# Patient Record
Sex: Male | Born: 1960 | Race: Black or African American | Hispanic: No | Marital: Single | Smoking: Current every day smoker
Health system: Southern US, Community
[De-identification: ages and names within clinical notes are randomized; demographics above are authoritative.]

## PROBLEM LIST (undated history)

## (undated) DIAGNOSIS — I10 Essential (primary) hypertension: Secondary | ICD-10-CM

## (undated) HISTORY — PX: ABDOMINAL SURGERY: SHX537

---

## 2013-04-27 ENCOUNTER — Emergency Department (HOSPITAL_BASED_OUTPATIENT_CLINIC_OR_DEPARTMENT_OTHER): Payer: Medicaid Other

## 2013-04-27 ENCOUNTER — Encounter (HOSPITAL_BASED_OUTPATIENT_CLINIC_OR_DEPARTMENT_OTHER): Payer: Self-pay | Admitting: Emergency Medicine

## 2013-04-27 ENCOUNTER — Emergency Department (HOSPITAL_BASED_OUTPATIENT_CLINIC_OR_DEPARTMENT_OTHER)
Admission: EM | Admit: 2013-04-27 | Discharge: 2013-04-27 | Disposition: A | Discharge status: Home / Self Care | Payer: Medicaid Other | Attending: Emergency Medicine | Admitting: Emergency Medicine

## 2013-04-27 DIAGNOSIS — K029 Dental caries, unspecified: Secondary | ICD-10-CM

## 2013-04-27 DIAGNOSIS — L0201 Cutaneous abscess of face: Secondary | ICD-10-CM | POA: Insufficient documentation

## 2013-04-27 DIAGNOSIS — K0520 Aggressive periodontitis, unspecified: Secondary | ICD-10-CM | POA: Insufficient documentation

## 2013-04-27 DIAGNOSIS — I10 Essential (primary) hypertension: Secondary | ICD-10-CM | POA: Insufficient documentation

## 2013-04-27 DIAGNOSIS — R6883 Chills (without fever): Secondary | ICD-10-CM | POA: Insufficient documentation

## 2013-04-27 DIAGNOSIS — L03211 Cellulitis of face: Secondary | ICD-10-CM | POA: Insufficient documentation

## 2013-04-27 DIAGNOSIS — F17200 Nicotine dependence, unspecified, uncomplicated: Secondary | ICD-10-CM | POA: Insufficient documentation

## 2013-04-27 HISTORY — DX: Essential (primary) hypertension: I10

## 2013-04-27 LAB — CBC WITH DIFFERENTIAL/PLATELET
BASOS ABS: 0 10*3/uL (ref 0.0–0.1)
BASOS PCT: 0 % (ref 0–1)
EOS ABS: 0.2 10*3/uL (ref 0.0–0.7)
Eosinophils Relative: 2 % (ref 0–5)
HCT: 44 % (ref 39.0–52.0)
Hemoglobin: 14.4 g/dL (ref 13.0–17.0)
Lymphocytes Relative: 19 % (ref 12–46)
Lymphs Abs: 1.4 10*3/uL (ref 0.7–4.0)
MCH: 28.1 pg (ref 26.0–34.0)
MCHC: 32.7 g/dL (ref 30.0–36.0)
MCV: 85.8 fL (ref 78.0–100.0)
Monocytes Absolute: 0.5 10*3/uL (ref 0.1–1.0)
Monocytes Relative: 6 % (ref 3–12)
NEUTROS ABS: 5.4 10*3/uL (ref 1.7–7.7)
Neutrophils Relative %: 72 % (ref 43–77)
PLATELETS: 290 10*3/uL (ref 150–400)
RBC: 5.13 MIL/uL (ref 4.22–5.81)
RDW: 14.9 % (ref 11.5–15.5)
WBC: 7.5 10*3/uL (ref 4.0–10.5)

## 2013-04-27 LAB — BASIC METABOLIC PANEL
BUN: 10 mg/dL (ref 6–23)
CHLORIDE: 100 meq/L (ref 96–112)
CO2: 26 mEq/L (ref 19–32)
Calcium: 9.4 mg/dL (ref 8.4–10.5)
Creatinine, Ser: 1 mg/dL (ref 0.50–1.35)
GFR calc Af Amer: >90 mL/min (ref >90)
GFR, EST NON AFRICAN AMERICAN: 85 mL/min — AB (ref >90)
Glucose, Bld: 116 mg/dL — ABNORMAL HIGH (ref 70–99)
POTASSIUM: 3.9 meq/L (ref 3.7–5.3)
Sodium: 139 mEq/L (ref 137–147)

## 2013-04-27 MED ORDER — IOHEXOL 300 MG/ML  SOLN
80.0000 mL | Freq: Once | INTRAMUSCULAR | Status: AC | PRN
  Administered 2013-04-27: 80 mL via INTRAVENOUS

## 2013-04-27 MED ORDER — SODIUM CHLORIDE 0.9 % IV BOLUS (SEPSIS)
1000.0000 mL | Freq: Once | INTRAVENOUS | Status: AC
  Administered 2013-04-27: 1000 mL via INTRAVENOUS

## 2013-04-27 MED ORDER — MORPHINE SULFATE 4 MG/ML IJ SOLN
4.0000 mg | Freq: Once | INTRAMUSCULAR | Status: AC
  Administered 2013-04-27: 4 mg via INTRAVENOUS
  Filled 2013-04-27: qty 1

## 2013-04-27 MED ORDER — CLINDAMYCIN PHOSPHATE 600 MG/50ML IV SOLN
600.0000 mg | Freq: Once | INTRAVENOUS | Status: AC
  Administered 2013-04-27: 600 mg via INTRAVENOUS
  Filled 2013-04-27: qty 50

## 2013-04-27 MED ORDER — HYDROCODONE-ACETAMINOPHEN 5-325 MG PO TABS: 1.0000 | ORAL_TABLET | ORAL | Status: DC | PRN

## 2013-04-27 NOTE — ED Provider Notes (Signed)
This chart was scribed for Steven MawKristen N Kengo Sturges, DO by Dorothey Basemania Sutton, ED Scribe. This patient was seen in room MH08/MH08 and the patient's care was started at 6:25 PM.  CHIEF COMPLAINT: cellulitis   HPI:  HPI Comments: Ephriam JenkinsRicky King is a 53 y.o. male who presents to the Emergency Department complaining of a constant pain to the right, upper dentition with associated gingival abscesses and chills onset 4-5 days ago. He reports associated swelling around the bilateral sides of the jaw onset a few hours ago that he states has been progressively worsening since he "popped" an abscess around the right, upper gingiva. Patient reports that he was evaluated at [facility] 2 days ago and started on clindamycin. He states his symptoms have not improved. He was seen by his PCP at [facility] today who is concerned he may have periorbital cellulitis and sent him to the emergency department for evaluation. He denies fever, emesis, diarrhea. He denies any allergies to medications. Patient reports that he does not currently have a dentist states he did call a dentist yesterday that he was referred to from [facility] he states he will see him once his swelling has improved. Patient has a history of HTN.    ROS: See HPI Constitutional: chills, no fever  Eyes: no drainage  ENT: dental problem, facial swelling, no runny nose   Cardiovascular: no chest pain  Resp: no SOB  GI: no vomiting, no diarrhea GU: no dysuria Integumentary: no rash  Allergy: no hives  Musculoskeletal: no leg swelling  Neurological: no slurred speech ROS otherwise negative  PAST MEDICAL HISTORY/PAST SURGICAL HISTORY:  Past Medical History  Diagnosis Date  . Hypertension     MEDICATIONS:  Prior to Admission medications   Medication Sig Start Date End Date Taking? Authorizing Provider  LISINOPRIL PO Take by mouth.   Yes Historical Provider, MD    ALLERGIES:  No Known Allergies  SOCIAL HISTORY:  History   Substance Use Topics  . Smoking status: Current Every Day Smoker -- 0.50 packs/day    Types: Cigarettes  . Smokeless tobacco: Not on file  . Alcohol Use: Yes    FAMILY HISTORY: No family history on file.  EXAM: Triage Vitals: BP 166/94  Pulse 95  Temp(Src) 99.2 F (37.3 C) (Oral)  Resp 20  Ht 5\' 11"  (1.803 m)  Wt 251 lb (113.853 kg)  BMI 35.02 kg/m2  SpO2 98%  CONSTITUTIONAL: Alert and oriented and responds appropriately to questions. Well-appearing; well-nourished HEAD: Normocephalic EYES: Conjunctivae clear, PERRL; EOMI intact and painless ENT: normal nose; no rhinorrhea; moist mucous membranes; pharynx without lesions noted; left-sided lower periorbital swelling with some mild bilateral facial swelling, no sublingual or mandibular swelling, no trismus or drooling; poor dentition with multiple fractured teeth and dental caries; no uvular deviation or muffled voice; no tonsillar hypertrophy or exudate NECK: Supple, no meningismus, no LAD  CARD: RRR; S1 and S2 appreciated; no murmurs, no clicks, no rubs, no gallops RESP: Normal chest excursion without splinting or tachypnea; breath sounds clear and equal bilaterally; no wheezes, no rhonchi, no rales,  ABD/GI: Normal bowel sounds; non-distended; soft, non-tender, no rebound, no guarding BACK:  The back appears normal and is non-tender to palpation, there is no CVA tenderness EXT: Normal ROM in all joints; non-tender to palpation; no edema; normal capillary refill; no cyanosis    SKIN: Normal color for age and race; warm NEURO: Moves all extremities equally PSYCH: The patient's mood and manner are appropriate. Grooming  and personal hygiene are appropriate.  MEDICAL DECISION MAKING:  6:30 PM- Patient with a history of poor dentition presents to the ED with some swelling to the left, lower periorbital area and bilateral sides of the face. He states that he was evaluated at [facility] for this 2 days ago and was started on  oral clindamycin, but was advised to come here due to concern for developing cellulitis by his PCP due to increased swelling under the left eye. Will order a maxillofacial CT to further delineate. Will order IV fluids, clindamycin, and morphine to cover for possible infection and to treat his pain. Also ordered CBC and BMP. Discussed that patient will need to follow up with a dentist to have the teeth extracted, so will provide him with a resource guide. Patient and family agreeable to plan.   ED PROGRESS:  DIAGNOSTIC STUDIES: Oxygen Saturation is 98% on room air, normal by my interpretation.    COORDINATION OF CARE: 6:30 PM- Ordered a maxillofacial CT, CBC, BMP. Ordered clindamycin, IV fluids, and morphine to manage symptoms. Discussed treatment plan with patient at bedside and patient verbalized agreement.    9:16 PM  Pt's labs are unremarkable.  CT scan shows prominent dental caries with no fluid collection to suggest abscess. There is also left infraorbital swelling consistent with cellulitis related to his odontogenic infection. There is no sign of post septal inflammation or cellulitis. Patient has 10 days of clindamycin. We'll have him continue this antibiotic. We'll discharge with pain medication. He states he has followup with a dentist next week. Have given strict return precautions and supportive care instructions. Patient and his mother at bedside verbalize understanding and are comfortable with plan.  Results for orders placed during the hospital encounter of 04/27/13  CBC WITH DIFFERENTIAL      Result Value Ref Range   WBC 7.5  4.0 - 10.5 K/uL   RBC 5.13  4.22 - 5.81 MIL/uL   Hemoglobin 14.4  13.0 - 17.0 g/dL   HCT 16.1  09.6 - 04.5 %   MCV 85.8  78.0 - 100.0 fL   MCH 28.1  26.0 - 34.0 pg   MCHC 32.7  30.0 - 36.0 g/dL   RDW 40.9  81.1 - 91.4 %   Platelets 290  150 - 400 K/uL   Neutrophils Relative % 72  43 - 77 %   Neutro Abs 5.4  1.7 - 7.7 K/uL   Lymphocytes Relative 19  12 -  46 %   Lymphs Abs 1.4  0.7 - 4.0 K/uL   Monocytes Relative 6  3 - 12 %   Monocytes Absolute 0.5  0.1 - 1.0 K/uL   Eosinophils Relative 2  0 - 5 %   Eosinophils Absolute 0.2  0.0 - 0.7 K/uL   Basophils Relative 0  0 - 1 %   Basophils Absolute 0.0  0.0 - 0.1 K/uL  BASIC METABOLIC PANEL      Result Value Ref Range   Sodium 139  137 - 147 mEq/L   Potassium 3.9  3.7 - 5.3 mEq/L   Chloride 100  96 - 112 mEq/L   CO2 26  19 - 32 mEq/L   Glucose, Bld 116 (*) 70 - 99 mg/dL   BUN 10  6 - 23 mg/dL   Creatinine, Ser 7.82  0.50 - 1.35 mg/dL   Calcium 9.4  8.4 - 95.6 mg/dL   GFR calc non Af Amer 85 (*) >90 mL/min   GFR  calc Af Amer >90  >90 mL/min   Ct Maxillofacial W/cm  04/27/2013   EXAM: CT MAXILLOFACIAL WITH CONTRAST  TECHNIQUE: Multidetector CT imaging of the maxillofacial structures was performed with intravenous contrast. Multiplanar CT image reconstructions were also generated. A small metallic BB was placed on the right temple in order to reliably differentiate right from left.  CONTRAST:  80mL OMNIPAQUE IOHEXOL 300 MG/ML  SOLN  COMPARISON:  None.  FINDINGS: Visualized portions of the brain are within normal limits.  The orbits are normal. Mild in for orbital soft tissue swelling seen on the left.  Prominent dental carie is seen involving the left maxillary second molar (series 2, image 42). No significant inflammatory changes seen within the adjacent left masticator space. No loculated fluid collection to suggest abscess. The oral cavity is within normal limits. Oropharynx and nasopharynx are normal. Parapharyngeal fat is preserved.  Mildly enlarged left level 1A node measures 1 cm in short access (series 3, image 7). A a mildly enlarged left level 2 node measures 1.2 cm in short axis (series 7, image 57). No other pathologically enlarged lymph nodes are identified within the partially visualized neck.  Moderate mucoperiosteal thickening present within the inferior aspect of the left maxillary  sinus. Minimal opacity noted within the posterior ethmoidal air cells bilaterally. Paranasal sinuses are otherwise clear.  No acute maxillofacial fracture.  Visualized portions of the upper cervical spine are within normal limits.  IMPRESSION: 1. Prominent dental caries involving the left second maxillary molar. No loculated fluid collection to suggest abscess identified. 2. Mild asymmetric left infraorbital soft tissue swelling. Finding may represent cellulitis related to odontogenic infection. Left globe is intact without evidence of postseptal inflammation. 3. Mild left maxillary paranasal sinus disease. 4. Mildly prominent left level I and II adenopathy, likely reactive in nature.   Electronically Signed   By: Rise Mu M.D.   On: 04/27/2013 20:32      I personally performed the services described in this documentation, which was scribed in my presence. The recorded information has been reviewed and is accurate.   Steven Maw Bracken Moffa, DO 04/27/13 [id number]

## 2013-04-27 NOTE — ED Notes (Addendum)
Abscess tooth x 4 days. [facility] today and told he has periorbital cellulitis and may need antibiotics

## 2013-04-27 NOTE — Discharge Instructions (Signed)
Cellulitis Cellulitis is an infection of the skin and the tissue beneath it. The infected area is usually red and tender. Cellulitis occurs most often in the arms and lower legs.  CAUSES  Cellulitis is caused by bacteria that enter the skin through cracks or cuts in the skin. The most common types of bacteria that cause cellulitis are Staphylococcus and Streptococcus. SYMPTOMS   Redness and warmth.  Swelling.  Tenderness or pain.  Fever. DIAGNOSIS  Your caregiver can usually determine what is wrong based on a physical exam. Blood tests may also be done. TREATMENT  Treatment usually involves taking an antibiotic medicine. HOME CARE INSTRUCTIONS   Take your antibiotics as directed. Finish them even if you start to feel better.  Keep the infected arm or leg elevated to reduce swelling.  Apply a warm cloth to the affected area up to 4 times per day to relieve pain.  Only take over-the-counter or prescription medicines for pain, discomfort, or fever as directed by your caregiver.  Keep all follow-up appointments as directed by your caregiver. SEEK MEDICAL CARE IF:   You notice red streaks coming from the infected area.  Your red area gets larger or turns dark in color.  Your bone or joint underneath the infected area becomes painful after the skin has healed.  Your infection returns in the same area or another area.  You notice a swollen bump in the infected area.  You develop new symptoms. SEEK IMMEDIATE MEDICAL CARE IF:   You have a fever.  You feel very sleepy.  You develop vomiting or diarrhea.  You have a general ill feeling (malaise) with muscle aches and pains. MAKE SURE YOU:   Understand these instructions.  Will watch your condition.  Will get help right away if you are not doing well or get worse. Document Released: 10/24/2004 Document Revised: 07/16/2011 Document Reviewed: 04/01/2011 [facility] Patient Information 2014 [place], [place].  Dental Caries    Dental caries (also called tooth decay) is the most common oral disease. It can occur at any age, but is more common in children and young adults.  HOW DENTAL CARIES DEVELOPS  The process of decay begins when bacteria and foods (particularly sugars and starches) combine in your mouth to produce plaque. Plaque is a substance that sticks to the hard, outer surface of a tooth (enamel). The bacteria in plaque produce acids that attack enamel. These acids may also attack the root surface of a tooth (cementum) if it is exposed. Repeated attacks dissolve these surfaces and create holes in the tooth (cavities). If left untreated, the acids destroy the other layers of the tooth.  RISK FACTORS  Frequent sipping of sugary beverages.   Frequent snacking on sugary and starchy foods, especially those that easily get stuck in the teeth.   Poor oral hygiene.   Dry mouth.   Substance abuse such as methamphetamine abuse.   Broken or poor-fitting dental restorations.   Eating disorders.   Gastroesophageal reflux disease (GERD).   Certain radiation treatments to the head and neck. SYMPTOMS In the early stages of dental caries, symptoms are seldom present. Sometimes white, chalky areas may be seen on the enamel or other tooth layers. In later stages, symptoms may include:  Pits and holes on the enamel.  Toothache after sweet, hot, or cold foods or drinks are consumed.  Pain around the tooth.  Swelling around the tooth. DIAGNOSIS  Most of the time, dental caries is detected during a regular dental checkup. A diagnosis  is made after a thorough medical and dental history is taken and the surfaces of your teeth are checked for signs of dental caries. Sometimes special instruments, such as lasers, are used to check for dental caries. Dental X-ray exams may be taken so that areas not visible to the eye (such as between the contact areas of the teeth) can be checked for cavities.  TREATMENT  If  dental caries is in its early stages, it may be reversed with a fluoride treatment or an application of a remineralizing agent at the dental office. Thorough brushing and flossing at home is needed to aid these treatments. If it is in its later stages, treatment depends on the location and extent of tooth destruction:   If a small area of the tooth has been destroyed, the destroyed area will be removed and cavities will be filled with a material such as gold, silver amalgam, or composite resin.   If a large area of the tooth has been destroyed, the destroyed area will be removed and a cap (crown) will be fitted over the remaining tooth structure.   If the center part of the tooth (pulp) is affected, a procedure called a root canal will be needed before a filling or crown can be placed.   If most of the tooth has been destroyed, the tooth may need to be pulled (extracted). HOME CARE INSTRUCTIONS You can prevent, stop, or reverse dental caries at home by practicing good oral hygiene. Good oral hygiene includes:  Thoroughly cleaning your teeth at least twice a day with a toothbrush and dental floss.   Using a fluoride toothpaste. A fluoride mouth rinse may also be used if recommended by your dentist or health care provider.   Restricting the amount of sugary and starchy foods and sugary liquids you consume.   Avoiding frequent snacking on these foods and sipping of these liquids.   Keeping regular visits with a dentist for checkups and cleanings. PREVENTION   Practice good oral hygiene.  Consider a dental sealant. A dental sealant is a coating material that is applied by your dentist to the pits and grooves of teeth. The sealant prevents food from being trapped in them. It may protect the teeth for several years.  Ask about fluoride supplements if you live in a community without fluorinated water or with water that has a low fluoride content. Use fluoride supplements as directed by  your dentist or health care provider.  Allow fluoride varnish applications to teeth if directed by your dentist or health care provider. Document Released: 10/06/2001 Document Revised: 09/16/2012 Document Reviewed: 01/17/2012 [facility] Patient Information 2014 [place], [place].  Facial Infection You have an infection of your face. This requires special attention to help prevent serious problems. Infections in facial wounds can cause poor healing and scars. They can also spread to deeper tissues, especially around the eye. Wound and dental infections can lead to sinusitis, infection of the eye socket, and even meningitis. Permanent damage to the skin, eye, and nervous system may result if facial infections are not treated properly. With severe infections, hospital care for IV antibiotic injections may be needed if they don't respond to oral antibiotics. Antibiotics must be taken for the full course to insure the infection is eliminated. If the infection came from a bad tooth, it may have to be extracted when the infection is under control. Warm compresses may be applied to reduce skin irritation and remove drainage. You might need a tetanus shot now if:  You cannot remember when your last tetanus shot was.  You have never had a tetanus shot.  The object that caused your wound was dirty. If you need a tetanus shot, and you decide not to get one, there is a rare chance of getting tetanus. Sickness from tetanus can be serious. If you got a tetanus shot, your arm may swell, get red and warm to the touch at the shot site. This is common and not a problem. SEEK IMMEDIATE MEDICAL CARE IF:   You have increased swelling, redness, or trouble breathing.  You have a severe headache, dizziness, nausea, or vomiting.  You develop problems with your eyesight.  You have a fever. Document Released: 02/22/2004 Document Revised: 04/08/2011 Document Reviewed: 01/14/2005 [facility] Patient Information 2014  [place].    Emergency Department Resource Guide 1) Find a Doctor and Pay Out of Pocket Although you won't have to find out who is covered by your insurance plan, it is a good idea to ask around and get recommendations. You will then need to call the office and see if the doctor you have chosen will accept you as a new patient and what types of options they offer for patients who are self-pay. Some doctors offer discounts or will set up payment plans for their patients who do not have insurance, but you will need to ask so you aren't surprised when you get to your appointment.  2) Contact Your Local Health Department Not all health departments have doctors that can see patients for sick visits, but many do, so it is worth a call to see if yours does. If you don't know where your local health department is, you can check in your phone book. The CDC also has a tool to help you locate your state's health department, and many state websites also have listings of all of their local health departments.  3) Find a Walk-in Clinic If your illness is not likely to be very severe or complicated, you may want to try a walk in clinic. These are popping up all over the country in pharmacies, drugstores, and shopping centers. They're usually staffed by nurse practitioners or physician assistants that have been trained to treat common illnesses and complaints. They're usually fairly quick and inexpensive. However, if you have serious medical issues or chronic medical problems, these are probably not your best option.  No Primary Care Doctor: - Call Health Connect at  [phone] - they can help you locate a primary care doctor that  accepts your insurance, provides certain services, etc. - Physician Referral Service- [phone]  Chronic Pain Problems: Organization         Address  Phone   Notes  [facility]  [phone] Patients need to be referred by their primary care doctor.    Medication Assistance: Organization         Address  Phone   Notes  [facility] [address] [place], [place] [id number] [phone] --Must be a resident of [facility] -- Must have NO insurance coverage whatsoever (no Medicaid/ Medicare, etc.) -- The pt. MUST have a primary care doctor that directs their care regularly and follows them in the community   [facility]ist  [phone]   [employer]  [phone]    Agencies that provide inexpensive medical care: Organization         Address  Phone   Notes  [facility]  [phone]   [facility]    [phone]   [facility] [address] [place], [place] [address] [phone]   [facility] [address], [place] [phone]   Planned Parenthood    [phone]   [facility]    [phone]   [facility]  [address], [place] Phone:  [phone], Fax:  [phone] Hours of Operation:  9 am - 6 pm, M-F.  Also accepts Medicaid/Medicare and self-pay.  [facility]  [address], [place] Phone: [phone], Fax: [phone]. Hours of Operation:  8:30 am - 5:30 pm, M-F.  Also accepts Medicaid and self-pay.  [facility] [place] [address] [place] Phone: [phone]   [facility] [address] Natasha Bence [place], [place] [phone], Ext. [phone] Mondays & Thursdays: 7-9 AM.  First 15 patients are seen on a first come, first serve basis.    Medicaid-accepting [facility] Providers:  Organization         Address  Phone   Notes  [facility] [address], [place] [phone] Also accepts self-pay patients.  [facility] [address] Laurell Josephs [place], [place]  [phone]   [facility] [address], [place] [phone]   [facility] [address], [place] [phone]   Renaye Rakers [address], [place]   [phone] Only accepts [place] patients after they have their name applied to their card.   Self-Pay (no insurance) in [facility]:  Organization         Address  Phone   Notes  Sickle Cell Patients, [facility] [address] [place], [place] [phone]   [facility] [address] [place], [place] [phone]   [facility] [place]  [address], [place] [phone]   [facility]/Dr. Osei-Bonsu  [address] [place] or [address], [place] [phone] Phone number for both [place] and [place] locations is the same.  [facility] [address], [place] [phone]   [facility] [address], [place] or [address] [phone]   [facility] [address], [place] [phone], phone; [phone], fax Sees patients 1st and 3rd Saturday of every month.  Must not qualify for public or private insurance (i.e. Medicaid, Medicare, [place] Health Choice, Veterans' Benefits)  Household income should be no more than 200% of the poverty level The clinic cannot treat you if you are pregnant or think you are pregnant  Sexually transmitted diseases are not treated at the clinic.    Dental Care: Organization         Address  Phone  Notes  [facility] of [facility] [address] [place], [place] [phone] Accepts children up to age 78 who are enrolled in [place] or [place] Health Choice; pregnant women with a Medicaid card; and children who have applied for Medicaid or [place] Health Choice, but were declined, whose parents can pay a reduced fee at time of service.    [facility] of [employer]  [address], [employer] [phone] Accepts children up to age 40 who are enrolled in Medicaid or [place] Health Choice; pregnant women with a Medicaid card; and children who have applied for Medicaid or  Health Choice, but were declined, whose parents can pay a reduced fee at time of service.  [facility]  [address] [place], [place] [phone] Patients are seen by appointment only. Walk-ins are not accepted. [facility] will see patients 64 years of age and older. Monday - Tuesday (8am-5pm) Most Wednesdays (8:30-5pm) $30 per visit, cash only  [facility] Adult [facility] Access PRO[facility]  [address], [facility] [phone] Patients are seen by appointment only. Walk-ins are not accepted. [facility] will see patients 80 years of age and older. One Wednesday Evening (Monthly: Volunteer Based).  $30 per visit, cash only  [employer]  [phone] for adults; Children under age 52, call Graduate Pediatric Dentistry at [phone]. Children aged 76-14, please call [phone] to request a pediatric application.  Dental services are provided in all areas of dental care including fillings, crowns and bridges, complete and partial dentures, implants, gum treatment, root canals, and extractions. Preventive care is also provided. Treatment is provided to both adults and children. Patients are selected via a lottery and there is often a waiting list.   [facility] [address] [place]  [phone] [email or web address]   [facility] [address] [place], [place] [phone], Ext. [phone] Second and Fourth Thursday of each month, opens at 6:30 AM; Clinic ends at 9 AM.  Patients are seen on a first-come first-served basis, and a limited number are seen during each clinic.   [facility]  [address] Ether Griffins [place], [place] [phone]   Eligibility Requirements You must have lived in [place], [place], or [place] counties for at least the last three months.   You cannot be eligible for state or federal sponsored [place], including [employer], [place], or [employer].   You generally cannot be eligible for healthcare insurance through your employer.    How to apply: Eligibility screenings are held every Tuesday and Wednesday afternoon from 1:00 pm until 4:00 pm. You do not need an appointment for the interview!  [facility] [address] [place], [place] [phone]   [facility] Health Department  [phone]   [facility] Department  [phone]   [facility]  [phone]    Behavioral Health Resources in the Community: Intensive Outpatient Programs Organization         Address  Phone  Notes  [facility] [address], [place], [place] [phone]   [facility]patient [address] [place], [place] [phone]   [facility] [address] [place], [place]  [phone]   [facility] Mental Health [address],  [place], [place] [phone] or [phone]   Substance Abuse Resources Organization         Address  Phone  Notes  [facility]  [phone]   [facility]  [phone]   [facility]  [phone]   Floydene Flock  [phone]   Residential & Outpatient Substance Abuse Program  [phone]   Psychological Services Organization         Address  Phone  Notes  [facility]  [phone]   [facility]  Services  [phone]   [facility] Mental Health [address] [place] [phone] or [phone]    Mobile Crisis Teams Organization         Address  Phone  Notes  [facility], Mobile Crisis Care Unit  [phone]   [facility]  [address] [place], [place] [phone]   Doristine Locks [address] [place] [phone]    Self-Help/Support Groups Organization         Address  Phone             Notes  [facility] [place] - variety of support groups  [phone]- I7437963 Call for more information  Narcotics Anonymous (NA), [facility] [address], [employer] [place]  2 meetings at this location   Statistician         Address  Phone  Notes  [facility] [address],    [place]  [phone]   [facility]  [address] [place] [address], [place], [place] [phone]   [facility] [address] [place], [place] [phone] Admissions: 8am-3pm M-F  [facility] [address],    [place], [place] [phone]   [facility] [address] [place], [place], [place] [phone]   [facility] [address],  [place], [place] [phone]   [facility] - Intensive Outpatient [address] Laurell Joseph[address], [place], [place] [phone]   [facility] ([facility] [address] [place]  [place], [place] [phone] or [phone]   [facility] ([facility]) [address] [place], [place] [phone] Accepts Medicaid  [facility] [place] [address],  [place] [phone] Substance Abuse/Addiction Treatment   [facility] Organization         Address  Phone  Notes  [facility]  [phone]   Angie Fava, PhD [address] Ervin Knack [place], [place]   [phone] or [phone]   [facility]   [address] [place], [place] [phone]   [facility] [address], [place], [place] [phone] Insurance/Medicaid/sponsorship through [facility] and Families [address]                                    [place], [place] [phone]  Therapy/tele-psych/case  [facility] [address] [place], [place] [phone]    Dr. Lolly Mustache  [phone]   [facility]  [employer] [facility] Dept. 1) [address], [place] 2) [address] [place] 3)  [address], [place] [phone]  [phone]   [facility] Child Abuse Hotline 681-655-9182 or [phone] (After Hours)

## 2013-06-26 ENCOUNTER — Encounter (HOSPITAL_BASED_OUTPATIENT_CLINIC_OR_DEPARTMENT_OTHER): Payer: Self-pay | Admitting: Emergency Medicine

## 2013-06-26 DIAGNOSIS — F17200 Nicotine dependence, unspecified, uncomplicated: Secondary | ICD-10-CM | POA: Insufficient documentation

## 2013-06-26 DIAGNOSIS — K0381 Cracked tooth: Secondary | ICD-10-CM | POA: Insufficient documentation

## 2013-06-26 DIAGNOSIS — K047 Periapical abscess without sinus: Secondary | ICD-10-CM | POA: Insufficient documentation

## 2013-06-26 DIAGNOSIS — I10 Essential (primary) hypertension: Secondary | ICD-10-CM | POA: Insufficient documentation

## 2013-06-26 DIAGNOSIS — K029 Dental caries, unspecified: Secondary | ICD-10-CM | POA: Insufficient documentation

## 2013-06-26 NOTE — ED Notes (Signed)
Pt reports dental pain to upper front tooth - presents with left sided facial swelling x2 days.

## 2013-06-27 ENCOUNTER — Emergency Department (HOSPITAL_BASED_OUTPATIENT_CLINIC_OR_DEPARTMENT_OTHER)
Admission: EM | Admit: 2013-06-27 | Discharge: 2013-06-27 | Disposition: A | Discharge status: Home / Self Care | Payer: Medicaid Other | Attending: Emergency Medicine | Admitting: Emergency Medicine

## 2013-06-27 DIAGNOSIS — K029 Dental caries, unspecified: Secondary | ICD-10-CM

## 2013-06-27 DIAGNOSIS — K047 Periapical abscess without sinus: Secondary | ICD-10-CM

## 2013-06-27 MED ORDER — CLINDAMYCIN HCL 300 MG PO CAPS: 300.0000 mg | ORAL_CAPSULE | Freq: Four times a day (QID) | ORAL | Status: DC

## 2013-06-27 MED ORDER — HYDROCODONE-ACETAMINOPHEN 5-325 MG PO TABS: 1.0000 | ORAL_TABLET | Freq: Four times a day (QID) | ORAL | Status: DC | PRN

## 2013-06-27 MED ORDER — CLINDAMYCIN HCL 150 MG PO CAPS
300.0000 mg | ORAL_CAPSULE | Freq: Once | ORAL | Status: AC
  Administered 2013-06-27: 300 mg via ORAL
  Filled 2013-06-27: qty 2

## 2013-06-27 MED ORDER — HYDROCODONE-ACETAMINOPHEN 5-325 MG PO TABS
2.0000 | ORAL_TABLET | Freq: Once | ORAL | Status: AC
  Administered 2013-06-27: 2 via ORAL
  Filled 2013-06-27: qty 2

## 2013-06-27 NOTE — Discharge Instructions (Signed)
°  Dental Abscess °A dental abscess is a collection of infected fluid (pus) from a bacterial infection in the inner part of the tooth (pulp). It usually occurs at the end of the tooth's root.  °CAUSES  °· Severe tooth decay. °· Trauma to the tooth that allows bacteria to enter into the pulp, such as a broken or chipped tooth. °SYMPTOMS  °· Severe pain in and around the infected tooth. °· Swelling and redness around the abscessed tooth or in the mouth or face. °· Tenderness. °· Pus drainage. °· Bad breath. °· Bitter taste in the mouth. °· Difficulty swallowing. °· Difficulty opening the mouth. °· Nausea. °· Vomiting. °· Chills. °· Swollen neck glands. °DIAGNOSIS  °· A medical and dental history will be taken. °· An examination will be performed by tapping on the abscessed tooth. °· X-rays may be taken of the tooth to identify the abscess. °TREATMENT °The goal of treatment is to eliminate the infection. You may be prescribed antibiotic medicine to stop the infection from spreading. A root canal may be performed to save the tooth. If the tooth cannot be saved, it may be pulled (extracted) and the abscess may be drained.  °HOME CARE INSTRUCTIONS °· Only take over-the-counter or prescription medicines for pain, fever, or discomfort as directed by your caregiver. °· Rinse your mouth (gargle) often with salt water (¼ tsp salt in 8 oz [250 ml] of warm water) to relieve pain or swelling. °· Do not drive after taking pain medicine (narcotics). °· Do not apply heat to the outside of your face. °· Return to your dentist for further treatment as directed. °SEEK MEDICAL CARE IF: °· Your pain is not helped by medicine. °· Your pain is getting worse instead of better. °SEEK IMMEDIATE MEDICAL CARE IF: °· You have a fever or persistent symptoms for more than 2 3 days. °· You have a fever and your symptoms suddenly get worse. °· You have chills or a very bad headache. °· You have problems breathing or swallowing. °· You have trouble  opening your mouth. °· You have swelling in the neck or around the eye. °Document Released: 01/14/2005 Document Revised: 10/09/2011 Document Reviewed: 04/24/2010 °ExitCare® Patient Information ©2014 Exit[employer], LLC. ° ° °

## 2013-06-27 NOTE — ED Provider Notes (Addendum)
CSN: [id number]     Arrival date & time 06/26/13  2310 History   First MD Initiated Contact with Patient 06/27/13 0059     Chief Complaint  Patient presents with  . Dental Pain     (Consider location/radiation/quality/duration/timing/severity/associated sxs/prior Treatment) HPI This is a 53 year old male who states he broke his left upper lateral incisor while eating yesterday. He is subsequently developed severe pain at the site with swelling of the left maxillary region. Pain is worse with attempted eating or palpation of the face. The patient states he has multiple dental caries.  Past Medical History  Diagnosis Date  . Hypertension    Past Surgical History  Procedure Laterality Date  . Abdominal surgery     History reviewed. No pertinent family history. History  Substance Use Topics  . Smoking status: Current Every Day Smoker -- 0.50 packs/day    Types: Cigarettes  . Smokeless tobacco: Not on file  . Alcohol Use: Yes     Comment: occasionally    Review of Systems  All other systems reviewed and are negative.   Allergies  Review of patient's allergies indicates no known allergies.  Home Medications   Prior to Admission medications   Medication Sig Start Date End Date Taking? Authorizing Provider  LISINOPRIL PO Take by mouth.   Yes Historical Provider, MD  HYDROcodone-acetaminophen (NORCO/VICODIN) 5-325 MG per tablet Take 1 tablet by mouth every 4 (four) hours as needed. 04/27/13   Kristen N Ward, DO   BP 175/97  Pulse 97  Temp(Src) 98.7 F (37.1 C) (Oral)  Resp 21  Ht 5\' 11"  (1.803 m)  Wt 258 lb (117.028 kg)  BMI 36.00 kg/m2  SpO2 97%  Physical Exam General: Well-developed, well-nourished male in no acute distress; appearance consistent with age of record HENT: normocephalic; atraumatic; widespread dental decay; fractured left upper lateral incisor, tender to percussion with edema and tenderness of the left maxillary region Eyes: pupils equal, round and  reactive to light; extraocular muscles intact Neck: supple Heart: regular rate and rhythm Lungs: Normal respiratory effort and excursion Abdomen: soft; nondistended Extremities: No deformity; full range of motion Neurologic: Awake, alert and oriented; motor function intact in all extremities and symmetric; no facial droop Skin: Warm and dry    ED Course  Procedures (including critical care time)  Patient refuses dental block.  MDM  Will start the patient on clindamycin. He was advised of the importance to followup with oral surgery. We will refer him to Dr. Chales Salmon and advised that he call tomorrow morning.    Hanley Seamen, MD 06/27/13 5003  Hanley Seamen, MD 06/27/13 7048  Hanley Seamen, MD 06/27/13 8891

## 2013-06-27 NOTE — ED Notes (Signed)
Family at bedside. Pt resting on stretcher.

## 2013-06-27 NOTE — ED Notes (Signed)
Pt with swelling to right side of face. Pt has been c/o of tooth pain. Swelling just occurred this evening.

## 2013-10-01 ENCOUNTER — Encounter (HOSPITAL_BASED_OUTPATIENT_CLINIC_OR_DEPARTMENT_OTHER): Payer: Self-pay | Admitting: Emergency Medicine

## 2013-10-01 ENCOUNTER — Emergency Department (HOSPITAL_BASED_OUTPATIENT_CLINIC_OR_DEPARTMENT_OTHER)
Admission: EM | Admit: 2013-10-01 | Discharge: 2013-10-01 | Disposition: A | Discharge status: Home / Self Care | Payer: Medicaid Other | Attending: Emergency Medicine | Admitting: Emergency Medicine

## 2013-10-01 DIAGNOSIS — Z79899 Other long term (current) drug therapy: Secondary | ICD-10-CM | POA: Diagnosis not present

## 2013-10-01 DIAGNOSIS — I10 Essential (primary) hypertension: Secondary | ICD-10-CM | POA: Diagnosis not present

## 2013-10-01 DIAGNOSIS — F17200 Nicotine dependence, unspecified, uncomplicated: Secondary | ICD-10-CM | POA: Insufficient documentation

## 2013-10-01 DIAGNOSIS — K047 Periapical abscess without sinus: Secondary | ICD-10-CM | POA: Diagnosis not present

## 2013-10-01 DIAGNOSIS — Z792 Long term (current) use of antibiotics: Secondary | ICD-10-CM | POA: Insufficient documentation

## 2013-10-01 MED ORDER — LISINOPRIL 10 MG PO TABS: 10.0000 mg | ORAL_TABLET | Freq: Every day | ORAL | Status: DC

## 2013-10-01 MED ORDER — PENICILLIN V POTASSIUM 250 MG PO TABS
500.0000 mg | ORAL_TABLET | Freq: Once | ORAL | Status: AC
  Administered 2013-10-01: 500 mg via ORAL
  Filled 2013-10-01: qty 2

## 2013-10-01 MED ORDER — OXYCODONE-ACETAMINOPHEN 5-325 MG PO TABS: 1.0000 | ORAL_TABLET | Freq: Once | ORAL | Status: DC

## 2013-10-01 MED ORDER — PENICILLIN V POTASSIUM 500 MG PO TABS: 500.0000 mg | ORAL_TABLET | Freq: Three times a day (TID) | ORAL | Status: DC

## 2013-10-01 MED ORDER — OXYCODONE-ACETAMINOPHEN 5-325 MG PO TABS: 1.0000 | ORAL_TABLET | ORAL | Status: DC | PRN

## 2013-10-01 NOTE — ED Notes (Addendum)
Pt states he went to PCP today for toothache x 2 days-they advised him his BP was too high and sent him here-pt states he has BP med but quit taking approx 1-2 months after only taking for 2 weeks-pt NAD-requesting abx and pain med for dental c/o

## 2013-10-01 NOTE — ED Notes (Signed)
Pt from [facility] for HTN. Pt has history, unsure of medications, hasn't taken them in 1-2 months

## 2013-10-01 NOTE — Discharge Instructions (Signed)
Dental Abscess A dental abscess is a collection of infected fluid (pus) from a bacterial infection in the inner part of the tooth (pulp). It usually occurs at the end of the tooth's root.  CAUSES   Severe tooth decay.  Trauma to the tooth that allows bacteria to enter into the pulp, such as a broken or chipped tooth. SYMPTOMS   Severe pain in and around the infected tooth.  Swelling and redness around the abscessed tooth or in the mouth or face.  Tenderness.  Pus drainage.  Bad breath.  Bitter taste in the mouth.  Difficulty swallowing.  Difficulty opening the mouth.  Nausea.  Vomiting.  Chills.  Swollen neck glands. DIAGNOSIS   A medical and dental history will be taken.  An examination will be performed by tapping on the abscessed tooth.  X-rays may be taken of the tooth to identify the abscess. TREATMENT The goal of treatment is to eliminate the infection. You may be prescribed antibiotic medicine to stop the infection from spreading. A root canal may be performed to save the tooth. If the tooth cannot be saved, it may be pulled (extracted) and the abscess may be drained.  HOME CARE INSTRUCTIONS  Only take over-the-counter or prescription medicines for pain, fever, or discomfort as directed by your caregiver.  Rinse your mouth (gargle) often with salt water ( tsp salt in 8 oz [250 ml] of warm water) to relieve pain or swelling.  Do not drive after taking pain medicine (narcotics).  Do not apply heat to the outside of your face.  Return to your dentist for further treatment as directed. SEEK MEDICAL CARE IF:  Your pain is not helped by medicine.  Your pain is getting worse instead of better. SEEK IMMEDIATE MEDICAL CARE IF:  You have a fever or persistent symptoms for more than 2-3 days.  You have a fever and your symptoms suddenly get worse.  You have chills or a very bad headache.  You have problems breathing or swallowing.  You have trouble  opening your mouth.  You have swelling in the neck or around the eye. Document Released: 01/14/2005 Document Revised: 10/09/2011 Document Reviewed: 04/24/2010 [facility] Patient Information 2015 [place], [place]. This information is not intended to replace advice given to you by your health care provider. Make sure you discuss any questions you have with your health care provider. Hypertension Hypertension, commonly called high blood pressure, is when the force of blood pumping through your arteries is too strong. Your arteries are the blood vessels that carry blood from your heart throughout your body. A blood pressure reading consists of a higher number over a lower number, such as 110/72. The higher number (systolic) is the pressure inside your arteries when your heart pumps. The lower number (diastolic) is the pressure inside your arteries when your heart relaxes. Ideally you want your blood pressure below 120/80. Hypertension forces your heart to work harder to pump blood. Your arteries may become narrow or stiff. Having hypertension puts you at risk for heart disease, stroke, and other problems.  RISK FACTORS Some risk factors for high blood pressure are controllable. Others are not.  Risk factors you cannot control include:   Race. You may be at higher risk if you are African American.  Age. Risk increases with age.  Gender. Men are at higher risk than women before age 58 years. After age 27, women are at higher risk than men. Risk factors you can control include:  Not getting enough exercise  or physical activity.  Being overweight.  Getting too much fat, sugar, calories, or salt in your diet.  Drinking too much alcohol. SIGNS AND SYMPTOMS Hypertension does not usually cause signs or symptoms. Extremely high blood pressure (hypertensive crisis) may cause headache, anxiety, shortness of breath, and nosebleed. DIAGNOSIS  To check if you have hypertension, your health care provider will  measure your blood pressure while you are seated, with your arm held at the level of your heart. It should be measured at least twice using the same arm. Certain conditions can cause a difference in blood pressure between your right and left arms. A blood pressure reading that is higher than normal on one occasion does not mean that you need treatment. If one blood pressure reading is high, ask your health care provider about having it checked again. TREATMENT  Treating high blood pressure includes making lifestyle changes and possibly taking medicine. Living a healthy lifestyle can help lower high blood pressure. You may need to change some of your habits. Lifestyle changes may include:  Following the DASH diet. This diet is high in fruits, vegetables, and whole grains. It is low in salt, red meat, and added sugars.  Getting at least 2 hours of brisk physical activity every week.  Losing weight if necessary.  Not smoking.  Limiting alcoholic beverages.  Learning ways to reduce stress. If lifestyle changes are not enough to get your blood pressure under control, your health care provider may prescribe medicine. You may need to take more than one. Work closely with your health care provider to understand the risks and benefits. HOME CARE INSTRUCTIONS  Have your blood pressure rechecked as directed by your health care provider.   Take medicines only as directed by your health care provider. Follow the directions carefully. Blood pressure medicines must be taken as prescribed. The medicine does not work as well when you skip doses. Skipping doses also puts you at risk for problems.   Do not smoke.   Monitor your blood pressure at home as directed by your health care provider. SEEK MEDICAL CARE IF:   You think you are having a reaction to medicines taken.  You have recurrent headaches or feel dizzy.  You have swelling in your ankles.  You have trouble with your vision. SEEK  IMMEDIATE MEDICAL CARE IF:  You develop a severe headache or confusion.  You have unusual weakness, numbness, or feel faint.  You have severe chest or abdominal pain.  You vomit repeatedly.  You have trouble breathing. MAKE SURE YOU:   Understand these instructions.  Will watch your condition.  Will get help right away if you are not doing well or get worse. Document Released: 01/14/2005 Document Revised: 05/31/2013 Document Reviewed: 11/06/2012 [facility] Patient Information 2015 [place], [place]. This information is not intended to replace advice given to you by your health care provider. Make sure you discuss any questions you have with your health care provider.

## 2013-10-01 NOTE — ED Notes (Signed)
Percocet not given due to pt is driving-mother is with pt but states she can not drive him-pt given rx x 3

## 2013-10-01 NOTE — ED Provider Notes (Signed)
CSN: [id number]     Arrival date & time 10/01/13  1442 History   First MD Initiated Contact with Patient 10/01/13 1535     Chief Complaint  Patient presents with  . Hypertension     (Consider location/radiation/quality/duration/timing/severity/associated sxs/prior Treatment) Patient is a 53 y.o. male presenting with tooth pain. The history is provided by the patient. No language interpreter was used.  Dental Pain Location:  Generalized Quality:  Aching and constant Severity:  Moderate Onset quality:  Gradual Associated symptoms: no facial swelling and no fever   Associated symptoms comment:  He reports pain in multiple teeth secondary to known decay over long period of time. No fever or facial swelling.    Past Medical History  Diagnosis Date  . Hypertension    Past Surgical History  Procedure Laterality Date  . Abdominal surgery     No family history on file. History  Substance Use Topics  . Smoking status: Current Every Day Smoker -- 0.50 packs/day    Types: Cigarettes  . Smokeless tobacco: Not on file  . Alcohol Use: Yes     Comment: occasionally    Review of Systems  Constitutional: Negative for fever and chills.  HENT: Positive for dental problem. Negative for facial swelling, sore throat and trouble swallowing.   Respiratory: Negative.   Cardiovascular: Negative for chest pain and leg swelling.  Gastrointestinal: Negative.  Negative for nausea.  Musculoskeletal: Negative.   Neurological: Negative.       Allergies  Review of patient's allergies indicates no known allergies.  Home Medications   Prior to Admission medications   Medication Sig Start Date End Date Taking? Authorizing Provider  clindamycin (CLEOCIN) 300 MG capsule Take 1 capsule (300 mg total) by mouth 4 (four) times daily. X 7 days 06/27/13   Hanley Seamen, MD  HYDROcodone-acetaminophen (NORCO/VICODIN) 5-325 MG per tablet Take 1 tablet by mouth every 4 (four) hours as needed. 04/27/13   Kristen  N Ward, DO  HYDROcodone-acetaminophen (NORCO/VICODIN) 5-325 MG per tablet Take 1-2 tablets by mouth every 6 (six) hours as needed for moderate pain. 06/27/13   Carlisle Beers Molpus, MD  LISINOPRIL PO Take by mouth.    Historical Provider, MD   BP 177/104  Pulse 84  Temp(Src) 97.8 F (36.6 C) (Oral)  Ht  (1.803 m)  Wt 270 lb (122.471 kg)  BMI 37.67 kg/m2  SpO2 97% Physical Exam  Constitutional: He is oriented to person, place, and time. He appears well-developed and well-nourished.  HENT:  Generally poor dentition with widespread dental decay. There is marked decay along lower incisors with multiple small pustules concerning for abscesses. No active drainage.   Neck: Normal range of motion.  Pulmonary/Chest: Effort normal.  Musculoskeletal: Normal range of motion.  Neurological: He is alert and oriented to person, place, and time.  Skin: Skin is warm and dry.  Psychiatric: He has a normal mood and affect.    ED Course  Procedures (including critical care time) Labs Review Labs Reviewed - No data to display  Imaging Review No results found.   EKG Interpretation None      MDM   Final diagnoses:  None    1. Dental abscesses 2. Hypertension  The patient is found to be hypertensive. He was apparently seen at [facility] prior to arrival where he went for dental pain. He was sent here for further evaluation of hypertension. He reports not taking his medication for over 2 months. No chest pain, SOB,  leg swelling. He states he has his medications at home and is encouraged to started taking them regularly. Dental abscesses treated with abx and pain management.     Arnoldo Hooker, PA-C 10/01/13 1625

## 2013-10-02 NOTE — ED Provider Notes (Signed)
Medical screening examination/treatment/procedure(s) were performed by non-physician practitioner and as supervising physician I was immediately available for consultation/collaboration.   EKG Interpretation None       Richad Ramsay M Macaria Bias, MD 10/02/13 0042 

## 2014-01-25 ENCOUNTER — Emergency Department (HOSPITAL_BASED_OUTPATIENT_CLINIC_OR_DEPARTMENT_OTHER)
Admission: EM | Admit: 2014-01-25 | Discharge: 2014-01-25 | Disposition: A | Discharge status: Home / Self Care | Payer: Medicaid Other | Attending: Emergency Medicine | Admitting: Emergency Medicine

## 2014-01-25 ENCOUNTER — Encounter (HOSPITAL_BASED_OUTPATIENT_CLINIC_OR_DEPARTMENT_OTHER): Payer: Self-pay | Admitting: *Deleted

## 2014-01-25 DIAGNOSIS — Z720 Tobacco use: Secondary | ICD-10-CM | POA: Diagnosis not present

## 2014-01-25 DIAGNOSIS — I10 Essential (primary) hypertension: Secondary | ICD-10-CM | POA: Diagnosis not present

## 2014-01-25 DIAGNOSIS — Z79899 Other long term (current) drug therapy: Secondary | ICD-10-CM | POA: Diagnosis not present

## 2014-01-25 DIAGNOSIS — K088 Other specified disorders of teeth and supporting structures: Secondary | ICD-10-CM | POA: Insufficient documentation

## 2014-01-25 DIAGNOSIS — K029 Dental caries, unspecified: Secondary | ICD-10-CM | POA: Insufficient documentation

## 2014-01-25 DIAGNOSIS — K0889 Other specified disorders of teeth and supporting structures: Secondary | ICD-10-CM

## 2014-01-25 DIAGNOSIS — R590 Localized enlarged lymph nodes: Secondary | ICD-10-CM | POA: Diagnosis not present

## 2014-01-25 DIAGNOSIS — R220 Localized swelling, mass and lump, head: Secondary | ICD-10-CM

## 2014-01-25 MED ORDER — OXYCODONE-ACETAMINOPHEN 5-325 MG PO TABS
2.0000 | ORAL_TABLET | Freq: Once | ORAL | Status: AC
  Administered 2014-01-25: 2 via ORAL
  Filled 2014-01-25: qty 2

## 2014-01-25 MED ORDER — PENICILLIN V POTASSIUM 500 MG PO TABS: 500.0000 mg | ORAL_TABLET | Freq: Four times a day (QID) | ORAL | Status: DC

## 2014-01-25 MED ORDER — OXYCODONE-ACETAMINOPHEN 5-325 MG PO TABS: 1.0000 | ORAL_TABLET | ORAL | Status: DC | PRN

## 2014-01-25 MED ORDER — ONDANSETRON 4 MG PO TBDP
4.0000 mg | ORAL_TABLET | Freq: Once | ORAL | Status: AC
  Administered 2014-01-25: 4 mg via ORAL
  Filled 2014-01-25: qty 1

## 2014-01-25 MED ORDER — PENICILLIN G BENZATHINE 1200000 UNIT/2ML IM SUSP
1.2000 10*6.[IU] | Freq: Once | INTRAMUSCULAR | Status: AC
  Administered 2014-01-25: 1.2 10*6.[IU] via INTRAMUSCULAR
  Filled 2014-01-25: qty 2

## 2014-01-25 NOTE — ED Notes (Signed)
Pt c/o dental pain x 2 days.  

## 2014-01-25 NOTE — Discharge Instructions (Signed)
You have been diagnosed with Dental pain. Please call the follow up dentist first thing in the morning on Monday for a follow up appointment. Keep your discharge paperwork from today's visit to bring to the dentist office. You may also use the resource guide listed below to help you find a dentist if you do not already have one to followup with. It is very important that you get evaluated by a dentist as soon as possible.  Use your pain medication as prescribed and do not operate heavy machinery while on pain medication. Note that your pain medication contains acetaminophen (Tylenol) & its is not reccommended that you use additional acetaminophen (Tylenol) while taking this medication. Take your full course of antibiotics. Read the instructions below. ° °Eat a soft or liquid diet and rinse your mouth out after meals with warm water. You should see a dentist or return here at once if you have increased swelling, increased pain or uncontrolled bleeding from the site of your injury. ° ° °SEEK MEDICAL CARE IF:  °· You have increased pain not controlled with medicines.  °· You have swelling around your tooth, in your face or neck.  °· You have bleeding which starts, continues, or gets worse.  °· You have a fever >101 °· If you are unable to open your mouth °Soft Diet  °The soft diet may be recommended after you were put on a full liquid diet. A normal diet may follow. The soft diet can also be used after surgery if you are too ill to keep down a normal diet. The soft diet may also be needed if you have a hard time chewing foods.  °DESCRIPTION  °Tender foods are used. Foods do not need to be ground or pureed. Most raw fruits and vegetables and coarse breads and cereals should be avoided. Fried foods and highly seasoned foods may cause discomfort.  °NUTRITIONAL ADEQUACY  °A healthy diet is possible if foods from each of the basic food groups are eaten daily.  °SOFT DIET FOOD LISTS  °Milk/Dairy  °Allowed: Milk and milk  drinks, milk shakes, cream cheese, cottage cheese, mild cheeses.  °Avoid: Sharp or highly seasoned cheese. °Meat/Meat Substitutes  °Allowed: Broiled, roasted, baked, or stewed tender lean beef, mutton, lamb, veal, chicken, turkey, liver, ham, crisp bacon, white fish, tuna, salmon. Eggs, smooth peanut butter.  °Avoid: All fried meats, fish, or fowl. Rich gravies and sauces. Lunch meats, sausages, hot dogs. Meats with gristle, chunky peanut butter. °Breads/Grains  °Allowed: Rice, noodles, spaghetti, macaroni. Dry or cooked refined cereals, such as farina, cream of wheat, oatmeal, grits, whole-wheat cereals. Plain or toasted white or wheat blend or whole-grain breads, soda crackers or saltines, flour tortillas.  °Avoid: Wild rice, coarse cereals, such as bran. Seed in or on breads and crackers. Bread or bread products with nuts or seeds. °Fruits/Vegetables  °Allowed: Fruit and vegetable juices, well-cooked or canned fruits and vegetables, any dried fruit. One citrus fruit daily, 1 vitamin A source daily. Well-ripened, easy to chew fruits, sweet potatoes. Baked, boiled, mashed, creamed, scalloped, or au gratin potatoes. Broths or creamed soups made with allowed vegetables, strained tomatoes.  °Avoid: All gas-forming vegetables (corn, radishes, Brussels sprouts, onions, broccoli, cabbage, parsnips, turnips, chili peppers, pinto beans, split peas, dried beans). Fruits containing seeds and skin. Potato chips and corn chips. All others that are not made with allowed vegetables. Highly seasoned soups. °Desserts/Sweets  °Allowed: Simple desserts, such as custard, junkets, gelatin desserts, plain ice cream and sherbets, simple cakes   and cookies, allowed fruits, sugar, syrup, jelly, honey, plain hard candy, and molasses.  °Avoid: Rich pastries, any dessert containing dates, nuts, raisins, or coconut. Fried pastries, such as doughnuts. Chocolate. °Beverages  °Allowed: Fruit and vegetable juices. Caffeine-free carbonated drinks,  coffee, and tea.  °Avoid: Caffeinated beverages: coffee, tea, soda or pop. °Miscellaneous  °Allowed: Butter, cream, margarine, mayonnaise, oil. Cream sauces, salt, and mild spices.  °Avoid: Highly spiced salad dressings. Highly seasoned foods, hot sauce, mustard, horseradish, and pepper. °SAMPLE MENU  °Breakfast  °Orange juice.  °Oatmeal.  °Soft cooked egg.  °Toast and margarine.  °2% milk.  °Coffee. °Lunch  °Meatloaf.  °Mashed potato.  °Green beans.  °Lemon pudding.  °Bread and margarine.  °Coffee. °Dinner  °Consommé or apricot nectar.  °Chicken breast.  °Rice, peas, and carrots.  °Applesauce.  °Bread and margarine.  °2% milk. °To cut the amount of fat in your diet, omit margarine and use 1% or skim milk.  °NUTRIENT ANALYSIS  °Calories........................1953 Kcal.  °Protein.........................102 gm.  °Carbohydrate...............247 gm.  °Fat................................65 gm.  °Cholesterol...................449 mg.  °Dietary fiber.................19 gm.  °Vitamin A.....................2944 RE.  °Vitamin C.....................79 mg.  °Niacin..........................25 mg.  °Riboflavin....................2.0 mg.  °Thiamin.......................1.5 mg.  °Folate..........................249 mcg.  °Calcium.......................1030 mg.  °Phosphorus.................1782 mg.  °Zinc..............................12 mg.  °Iron..............................13 mg.  °Sodium.........................299 mg.  °Potassium....................3046 mg. °Document Released: 04/23/2007 Document Revised: 04/08/2011 Document Reviewed: 04/23/2007  °Exit[employer]® Patient Information ©2014 [employer].  ° °RESOURCE GUIDE ° ° °Dental Problems ° °Dr. Janna Civilis °$200 dollar visit °[address] °[place], [place] [address]  °[phone] °  ° °Patients with Medicaid: °[facility]                     [facility] °[address]                                           [address] °Phone:   [phone]                                                  Phone:  [phone] ° °If unable to pay or uninsured, contact:  [facility] or [facility] to become qualified for the adult dental clinic. ° °Chronic Pain Problems °Contact [facility]  [phone] °Patients need to be referred by their primary care doctor. ° °Insufficient Money for Medicine °Contact [employer]:  call "211" or [facility] [phone]. ° °No Primary Care Doctor °Call [facility]  832-8000 °Other agencies that provide inexpensive medical care °   [facility]  [phone] °    Internal Medicine  [phone] °   [facility]  [phone] °   Women's Clinic  [phone] °   [facility]  [phone] °   [facility]  [phone] ° °Psychological Services °[facility]  [phone] °[facility]  [phone] °[facility]   [phone] (emergency services 641-4993) ° °Substance Abuse Resources °[facility]  336-882-2125 °[facility] 336-784-9470 °[facility] 336-285-9073 °[facility] 336-845-3988 °Residential & Outpatient Substance Abuse Program  800-659-3381 ° °Abuse/Neglect °[place] [facility] (336) 641-3795 °[place] [facility] Hot[facility] 800-378-5315 (After Hours) ° °Emergency Shelter °   [facility] (336) 271-5985 ° °Maternity Homes °[facility] (336) 275-9566 °[facility] (704) 372-4663 ° °MRSA Hotline #:   832-7006 ° ° ° °[place] [facility] ° °[facility]     [employer]                          [facility] °[address] [place]                       [address]      [address]  °[place]                                                [place]                            [place] °Phone:  349-3220                                   Phone:  [phone]                 Phone:   [phone] ° °[facility] °Phone:  342-8316 ° °[place] [facility] Hotline °(336) 342-1394 °(336) 342-3537 (After Hours) ° ° ° ° ° ° °

## 2014-01-25 NOTE — ED Provider Notes (Addendum)
CSN: [id number]     Arrival date & time 01/25/14  1423 History   First MD Initiated Contact with Patient 01/25/14 1517     Chief Complaint  Patient presents with  . Dental Pain     (Consider location/radiation/quality/duration/timing/severity/associated sxs/prior Treatment) Patient is a 53 y.o. male presenting with tooth pain. The history is provided by the patient. No language interpreter was used.  Dental Pain Location:  Lower Lower teeth location:  29/RL 2nd bicuspid Quality:  Constant, aching and pressure-like Severity:  Severe Onset quality:  Gradual Duration:  2 days Timing:  Constant Progression:  Worsening Chronicity:  Recurrent Context: dental caries and poor dentition   Context: not dental fracture and not malocclusion   Relieved by:  Nothing Worsened by:  Cold food/drink, hot food/drink, touching, pressure and jaw movement Ineffective treatments:  Acetaminophen, NSAIDs and topical anesthetic gel Associated symptoms: facial pain, facial swelling and gum swelling   Associated symptoms: no congestion, no difficulty swallowing, no drooling, no fever, no headaches, no neck pain, no neck swelling, no oral bleeding, no oral lesions and no trismus    Past Medical History  Diagnosis Date  . Hypertension    Past Surgical History  Procedure Laterality Date  . Abdominal surgery     History reviewed. No pertinent family history. History  Substance Use Topics  . Smoking status: Current Every Day Smoker -- 0.50 packs/day    Types: Cigarettes  . Smokeless tobacco: Not on file  . Alcohol Use: Yes     Comment: occasionally    Review of Systems  Unable to perform ROS Constitutional: Negative for fever.  HENT: Positive for facial swelling. Negative for congestion, drooling and mouth sores.   Musculoskeletal: Negative for neck pain.  Neurological: Negative for headaches.  All other systems reviewed and are negative.     Allergies  Review of patient's allergies  indicates no known allergies.  Home Medications   Prior to Admission medications   Medication Sig Start Date End Date Taking? Authorizing Provider  HYDROcodone-acetaminophen (NORCO/VICODIN) 5-325 MG per tablet Take 1 tablet by mouth every 4 (four) hours as needed. 04/27/13   Kristen N Ward, DO  HYDROcodone-acetaminophen (NORCO/VICODIN) 5-325 MG per tablet Take 1-2 tablets by mouth every 6 (six) hours as needed for moderate pain. 06/27/13   John L Molpus, MD  lisinopril (PRINIVIL) 10 MG tablet Take 1 tablet (10 mg total) by mouth daily. 10/01/13   Shari A Upstill, PA-C  LISINOPRIL PO Take by mouth.    Historical Provider, MD  penicillin v potassium (VEETID) 500 MG tablet Take 1 tablet (500 mg total) by mouth 3 (three) times daily. 10/01/13   Shari A Upstill, PA-C   BP 157/107 mmHg  Pulse 95  Temp(Src) 98.8 F (37.1 C)  Resp 16  Ht 5\' 11"  (1.803 m)  Wt 265 lb (120.203 kg)  BMI 36.98 kg/m2  SpO2 95% Physical Exam  Constitutional: He appears well-developed and well-nourished. No distress.  HENT:  Head: Normocephalic and atraumatic.  Mouth/Throat: Uvula is midline and oropharynx is clear and moist.    + facial swelling and tenderness. + gum swelling no overt fluctuance.   Eyes: Conjunctivae are normal. No scleral icterus.  Neck: Normal range of motion. Neck supple.  Cardiovascular: Normal rate, regular rhythm and normal heart sounds.   Pulmonary/Chest: Effort normal and breath sounds normal. No respiratory distress.  Abdominal: Soft. There is no tenderness.  Musculoskeletal: He exhibits no edema.  Neurological: He is alert.  Skin: Skin is  warm and dry. He is not diaphoretic.  Psychiatric: His behavior is normal.  Nursing note and vitals reviewed.   ED Course  Procedures (including critical care time) Labs Review Labs Reviewed - No data to display  Imaging Review No results found.   EKG Interpretation None      MDM   Final diagnoses:  None     Patient with toothache.   Moderate tonsilar swelling with no sublingual swelling +submental lymphadenopathy. No gross abscess. Bicillin shot here. Exam unconcerning for Ludwig's angina or spread of infection.  Will treat with penicillin and pain medicine.  Urged patient to follow-up with dentist.       Arthor CaptainAbigail Iyanni Hepp, PA-C 01/25/14 1613  Enid SkeensJoshua M Zavitz, MD 01/25/14 2355  Arthor CaptainAbigail Kadeem Hyle, PA-C 01/26/14 1528  Enid SkeensJoshua M Zavitz, MD 01/27/14 [phone]

## 2014-01-26 ENCOUNTER — Emergency Department (HOSPITAL_BASED_OUTPATIENT_CLINIC_OR_DEPARTMENT_OTHER): Payer: Medicaid Other

## 2014-01-26 ENCOUNTER — Encounter (HOSPITAL_BASED_OUTPATIENT_CLINIC_OR_DEPARTMENT_OTHER): Payer: Self-pay | Admitting: *Deleted

## 2014-01-26 ENCOUNTER — Emergency Department (HOSPITAL_BASED_OUTPATIENT_CLINIC_OR_DEPARTMENT_OTHER)
Admission: EM | Admit: 2014-01-26 | Discharge: 2014-01-26 | Disposition: A | Discharge status: Other Acute Inpatient Hospital | Payer: Medicaid Other | Attending: Emergency Medicine | Admitting: Emergency Medicine

## 2014-01-26 DIAGNOSIS — R221 Localized swelling, mass and lump, neck: Secondary | ICD-10-CM

## 2014-01-26 DIAGNOSIS — R220 Localized swelling, mass and lump, head: Secondary | ICD-10-CM | POA: Diagnosis present

## 2014-01-26 DIAGNOSIS — Z792 Long term (current) use of antibiotics: Secondary | ICD-10-CM | POA: Diagnosis not present

## 2014-01-26 DIAGNOSIS — I10 Essential (primary) hypertension: Secondary | ICD-10-CM | POA: Insufficient documentation

## 2014-01-26 DIAGNOSIS — K047 Periapical abscess without sinus: Secondary | ICD-10-CM | POA: Diagnosis not present

## 2014-01-26 DIAGNOSIS — Z79899 Other long term (current) drug therapy: Secondary | ICD-10-CM | POA: Insufficient documentation

## 2014-01-26 DIAGNOSIS — L03211 Cellulitis of face: Secondary | ICD-10-CM | POA: Diagnosis not present

## 2014-01-26 DIAGNOSIS — R509 Fever, unspecified: Secondary | ICD-10-CM

## 2014-01-26 DIAGNOSIS — R0902 Hypoxemia: Secondary | ICD-10-CM

## 2014-01-26 DIAGNOSIS — Z720 Tobacco use: Secondary | ICD-10-CM | POA: Diagnosis not present

## 2014-01-26 LAB — CBC WITH DIFFERENTIAL/PLATELET
BASOS ABS: 0 10*3/uL (ref 0.0–0.1)
BASOS PCT: 0 % (ref 0–1)
Eosinophils Absolute: 0 10*3/uL (ref 0.0–0.7)
Eosinophils Relative: 0 % (ref 0–5)
HEMATOCRIT: 40.8 % (ref 39.0–52.0)
HEMOGLOBIN: 13.4 g/dL (ref 13.0–17.0)
LYMPHS PCT: 12 % (ref 12–46)
Lymphs Abs: 1.4 10*3/uL (ref 0.7–4.0)
MCH: 28.8 pg (ref 26.0–34.0)
MCHC: 32.8 g/dL (ref 30.0–36.0)
MCV: 87.6 fL (ref 78.0–100.0)
MONO ABS: 0.8 10*3/uL (ref 0.1–1.0)
MONOS PCT: 7 % (ref 3–12)
Neutro Abs: 9.2 10*3/uL — ABNORMAL HIGH (ref 1.7–7.7)
Neutrophils Relative %: 81 % — ABNORMAL HIGH (ref 43–77)
Platelets: 289 10*3/uL (ref 150–400)
RBC: 4.66 MIL/uL (ref 4.22–5.81)
RDW: 14.1 % (ref 11.5–15.5)
WBC: 11.4 10*3/uL — AB (ref 4.0–10.5)

## 2014-01-26 LAB — BASIC METABOLIC PANEL
ANION GAP: 6 (ref 5–15)
BUN: 8 mg/dL (ref 6–23)
CO2: 27 mmol/L (ref 19–32)
CREATININE: 1 mg/dL (ref 0.50–1.35)
Calcium: 8.3 mg/dL — ABNORMAL LOW (ref 8.4–10.5)
Chloride: 104 mEq/L (ref 96–112)
GFR calc non Af Amer: 85 mL/min — ABNORMAL LOW (ref >90)
Glucose, Bld: 125 mg/dL — ABNORMAL HIGH (ref 70–99)
POTASSIUM: 4 mmol/L (ref 3.5–5.1)
Sodium: 137 mmol/L (ref 135–145)

## 2014-01-26 LAB — BRAIN NATRIURETIC PEPTIDE: B Natriuretic Peptide: 36.9 pg/mL (ref 0.0–100.0)

## 2014-01-26 LAB — TROPONIN I: Troponin I: <0.03 ng/mL (ref <0.031)

## 2014-01-26 MED ORDER — IPRATROPIUM-ALBUTEROL 0.5-2.5 (3) MG/3ML IN SOLN
3.0000 mL | RESPIRATORY_TRACT | Status: DC
  Administered 2014-01-26: 3 mL via RESPIRATORY_TRACT

## 2014-01-26 MED ORDER — FUROSEMIDE 10 MG/ML IJ SOLN
40.0000 mg | Freq: Once | INTRAMUSCULAR | Status: AC
  Administered 2014-01-26: 40 mg via INTRAVENOUS
  Filled 2014-01-26: qty 4

## 2014-01-26 MED ORDER — KETOROLAC TROMETHAMINE 30 MG/ML IJ SOLN
30.0000 mg | Freq: Once | INTRAMUSCULAR | Status: AC
  Administered 2014-01-26: 30 mg via INTRAVENOUS
  Filled 2014-01-26: qty 1

## 2014-01-26 MED ORDER — MORPHINE SULFATE 4 MG/ML IJ SOLN
4.0000 mg | Freq: Once | INTRAMUSCULAR | Status: AC
Start: 2014-01-26 — End: 2014-01-26
  Administered 2014-01-26: 4 mg via INTRAVENOUS
  Filled 2014-01-26: qty 1

## 2014-01-26 MED ORDER — CLINDAMYCIN PHOSPHATE 600 MG/50ML IV SOLN
600.0000 mg | Freq: Once | INTRAVENOUS | Status: AC
Start: 2014-01-26 — End: 2014-01-26
  Administered 2014-01-26: 600 mg via INTRAVENOUS
  Filled 2014-01-26: qty 50

## 2014-01-26 MED ORDER — IOHEXOL 300 MG/ML  SOLN
80.0000 mL | Freq: Once | INTRAMUSCULAR | Status: AC | PRN
  Administered 2014-01-26: 80 mL via INTRAVENOUS

## 2014-01-26 MED ORDER — DEXAMETHASONE SODIUM PHOSPHATE 10 MG/ML IJ SOLN
10.0000 mg | Freq: Once | INTRAMUSCULAR | Status: AC
  Administered 2014-01-26: 10 mg via INTRAVENOUS
  Filled 2014-01-26: qty 1

## 2014-01-26 MED ORDER — SODIUM CHLORIDE 0.9 % IV BOLUS (SEPSIS): 1000.0000 mL | Freq: Once | INTRAVENOUS | Status: DC

## 2014-01-26 MED ORDER — ONDANSETRON HCL 4 MG/2ML IJ SOLN
4.0000 mg | Freq: Once | INTRAMUSCULAR | Status: AC
  Administered 2014-01-26: 4 mg via INTRAVENOUS

## 2014-01-26 MED ORDER — IPRATROPIUM-ALBUTEROL 0.5-2.5 (3) MG/3ML IN SOLN
RESPIRATORY_TRACT | Status: AC
  Filled 2014-01-26: qty 3

## 2014-01-26 NOTE — ED Notes (Signed)
Pt to room 2 in w/c, able to stand and take steps to bed, pt reports being seen yesterday for "tooth infection", was given rx for pcn and pcn injection, today presents with fever and increased swelling.

## 2014-01-26 NOTE — ED Notes (Signed)
Patient transported to CT 

## 2014-01-26 NOTE — ED Provider Notes (Signed)
TIME SEEN: 8:05 AM  CHIEF COMPLAINT: Facial swelling, fever  HPI: Pt is a 53 y.o. M with history of hypertension, tobacco use and poor dentition who presents emergency and with right-sided dental pain and now facial swelling, submandibular swelling and fever. Was seen yesterday for dental pain and was started on penicillin. At that time he had no facial swelling and no fever. Patient reports that started overnight. He did fill his penicillin prescription and started this medication yesterday. He is able to swallow his secretions but has significant pain with opening his mouth. He does not have a dentist. Denies vomiting or diarrhea. Denies trauma to the face.  ROS: See HPI Constitutional: no fever  Eyes: no drainage  ENT: no runny nose   Cardiovascular:  no chest pain  Resp: no SOB  GI: no vomiting GU: no dysuria Integumentary: no rash  Allergy: no hives  Musculoskeletal: no leg swelling  Neurological: no slurred speech ROS otherwise negative  PAST MEDICAL HISTORY/PAST SURGICAL HISTORY:  Past Medical History  Diagnosis Date  . Hypertension     MEDICATIONS:  Prior to Admission medications   Medication Sig Start Date End Date Taking? Authorizing Provider  HYDROcodone-acetaminophen (NORCO/VICODIN) 5-325 MG per tablet Take 1 tablet by mouth every 4 (four) hours as needed. 04/27/13   Shelsy Seng N Chamille Werntz, DO  HYDROcodone-acetaminophen (NORCO/VICODIN) 5-325 MG per tablet Take 1-2 tablets by mouth every 6 (six) hours as needed for moderate pain. 06/27/13   John L Molpus, MD  lisinopril (PRINIVIL) 10 MG tablet Take 1 tablet (10 mg total) by mouth daily. 10/01/13   Shari A Upstill, PA-C  LISINOPRIL PO Take by mouth.    Historical Provider, MD  oxyCODONE-acetaminophen (PERCOCET) 5-325 MG per tablet Take 1-2 tablets by mouth every 4 (four) hours as needed. 01/25/14   Arthor CaptainAbigail Harris, PA-C  penicillin v potassium (VEETID) 500 MG tablet Take 1 tablet (500 mg total) by mouth 4 (four) times daily. 01/25/14    Arthor CaptainAbigail Harris, PA-C    ALLERGIES:  No Known Allergies  SOCIAL HISTORY:  History  Substance Use Topics  . Smoking status: Current Every Day Smoker -- 0.50 packs/day    Types: Cigarettes  . Smokeless tobacco: Not on file  . Alcohol Use: Yes     Comment: occasionally    FAMILY HISTORY: History reviewed. No pertinent family history.  EXAM: BP 153/101 mmHg  Pulse 105  Temp(Src) 100.8 F (38.2 C) (Oral)  Resp 20  Ht 5\' 11"  (1.803 m)  Wt 266 lb (120.657 kg)  BMI 37.12 kg/m2  SpO2 92% CONSTITUTIONAL: Alert and oriented and responds appropriately to questions. Appears uncomfortable but is nontoxic HEAD: Normocephalic EYES: Conjunctivae clear, PERRL ENT: normal nose; no rhinorrhea; moist mucous membranes; pharynx without lesions noted; no drooling but patient does have pain with opening his mouth, multiple dental caries with no obvious sign of abscess, multiple missing teeth, no muffled voice, patient does have significant submandibular swelling that is very tense but his tongue sits flat in the bottom of his mouth, there is no angioedema, there swelling and warmth to the right mandibular area as well NECK: Supple, no meningismus, no LAD  CARD: RRR; S1 and S2 appreciated; no murmurs, no clicks, no rubs, no gallops RESP: Normal chest excursion without splinting or tachypnea; breath sounds clear and equal bilaterally; no wheezes, no rhonchi, no rales ABD/GI: Normal bowel sounds; non-distended; soft, non-tender, no rebound, no guarding BACK:  The back appears normal and is non-tender to palpation, there is no CVA  tenderness EXT: Normal ROM in all joints; non-tender to palpation; no edema; normal capillary refill; no cyanosis    SKIN: Normal color for age and race; warm NEURO: Moves all extremities equally PSYCH: The patient's mood and manner are appropriate. Grooming and personal hygiene are appropriate.  MEDICAL DECISION MAKING: Patient here with significant facial swelling and  submandibular swelling. His tongue does sit flat in the bottom of his mouth and he has no drooling and swallowing his saliva but does have some trismus. Suspect facial cellulitis from an odontogenic source. Will obtain a CT of his face, give IV clindamycin, Decadron, Toradol and morphine, IV fluids and Zofran. We'll obtain basic labs. Will cllosely monitor. He has no respiratory distress, speaking full sentences.  ED PROGRESS: 8:40 AM  Pt became hypoxic before receiving narcotic pain medicine. Placed on 2 L nasal cannula. Does not wear oxygen at home. Was taken to CT scan and given IV contrast and while lying flat became more short of breath. He does now have some very mild expiratory wheezing and crackles at his bases. We'll give DuoNeb treatment. No hives or angioedema on exam. No increased swelling. Still has a normal voice and is swallowing secretions. Denies chest pain. Will obtain portable chest x-ray, add on troponin and BNP. His EKG shows inferior lateral T wave inversions with no old for comparison.  9:09 AM  Pt's willing is improving and he is not able to open his mouth more. He still however has an oxygen requirement is 92% on 2 L but in no distress. Chest x-ray shows low-grade CHF. We'll stop IV fluids and give IV Lasix. BNP is pending. Patient is [facility].  I feel he will need admission to the hospital. He states he [facility]. His CT scan does show multiple small periapical abscesses. There is nothing that needs surgical drainage at this time but he does have significant facial cellulitis. Given this with his fever, leukocytosis and hypoxia feel he will need admission.  9:17 AM  Dr. Marney SettingSurles [phone]) is the dentist on-call for [facility]. I have called this number and left a message.   9:29 AM  Pt's troponin is negative and BNP is normal. Awaiting callback from hospitalist. He should still  hemodynamically stable, oxygen levels are improving, 95% on 2 L but he still has an oxygen requirement.   10:00 AM  Accept[facility] by [facility] Christ[facility].    EKG Interpretation  Date/Time:  Wednesday January 26 2014 08:36:35 EST Ventricular Rate:  98 PR Interval:  120 QRS Duration: 98 QT Interval:  340 QTC Calculation: 434 R Axis:   -21 Text Interpretation:  Normal sinus rhythm ST \\T \ T wave abnormality, consider inferolateral ischemia Abnormal ECG No old tracing to compare Confirmed by Jerrol Helmers,  DO, Laisha Rau [phone]([id number]) on 01/26/2014 8:40:36 AM           Layla MawKristen N Kevonta Phariss, DO 01/26/14 1001

## 2014-01-31 ENCOUNTER — Emergency Department (HOSPITAL_BASED_OUTPATIENT_CLINIC_OR_DEPARTMENT_OTHER)
Admission: EM | Admit: 2014-01-31 | Discharge: 2014-01-31 | Disposition: A | Discharge status: Home / Self Care | Payer: Medicaid Other | Attending: Emergency Medicine | Admitting: Emergency Medicine

## 2014-01-31 ENCOUNTER — Encounter (HOSPITAL_BASED_OUTPATIENT_CLINIC_OR_DEPARTMENT_OTHER): Payer: Self-pay

## 2014-01-31 DIAGNOSIS — Z720 Tobacco use: Secondary | ICD-10-CM | POA: Diagnosis not present

## 2014-01-31 DIAGNOSIS — K047 Periapical abscess without sinus: Secondary | ICD-10-CM | POA: Insufficient documentation

## 2014-01-31 DIAGNOSIS — L03211 Cellulitis of face: Secondary | ICD-10-CM | POA: Insufficient documentation

## 2014-01-31 DIAGNOSIS — Z79899 Other long term (current) drug therapy: Secondary | ICD-10-CM | POA: Diagnosis not present

## 2014-01-31 DIAGNOSIS — Z760 Encounter for issue of repeat prescription: Secondary | ICD-10-CM | POA: Diagnosis present

## 2014-01-31 DIAGNOSIS — I10 Essential (primary) hypertension: Secondary | ICD-10-CM | POA: Insufficient documentation

## 2014-01-31 MED ORDER — OXYCODONE-ACETAMINOPHEN 5-325 MG PO TABS: 1.0000 | ORAL_TABLET | Freq: Four times a day (QID) | ORAL | Status: DC | PRN

## 2014-01-31 NOTE — ED Provider Notes (Signed)
CSN: [id number]     Arrival date & time 01/31/14  0910 History   First MD Initiated Contact with Patient 01/31/14 [phone]     Chief Complaint  Patient presents with  . Facial Swelling  . Medication Refill     (Consider location/radiation/quality/duration/timing/severity/associated sxs/prior Treatment) HPI Comments: Patient presents today with a chief complaint of dental pain and facial swelling.  He was seen in the ED on 01/26/14 for the same. At that time he had a CT maxillofacial, which showed poor dentition with numerous cavities and small/early periapical abscesses involving the upper and lower incisors and right lower canine.  At that time he was admitted to [facility] and given IV antibiotics.  He was discharged from [facility] on 01/29/14.  He states that he is currently on Clindamycin, which he reports that he has been taking as directed.  He reports that the facial swelling is significantly improving.  He was taking Percocet for pain, which was helping.  However, he states that he ran out of the Percocet.  He denies fever, chills, nausea, vomiting, difficulty swallowing, trismus, neck stiffness, SOB, or chest pain.  He reports that he has a dentist appointment scheduled, but cannot remember the exact date.  The history is provided by the patient.    Past Medical History  Diagnosis Date  . Hypertension    Past Surgical History  Procedure Laterality Date  . Abdominal surgery     No family history on file. History  Substance Use Topics  . Smoking status: Current Every Day Smoker -- 0.50 packs/day    Types: Cigarettes  . Smokeless tobacco: Not on file  . Alcohol Use: Yes     Comment: occasionally    Review of Systems  All other systems reviewed and are negative.     Allergies  Review of patient's allergies indicates no known allergies.  Home Medications   Prior to Admission medications   Medication Sig Start Date End Date Taking? Authorizing Provider   HYDROcodone-acetaminophen (NORCO/VICODIN) 5-325 MG per tablet Take 1 tablet by mouth every 4 (four) hours as needed. 04/27/13   Kristen N Ward, DO  HYDROcodone-acetaminophen (NORCO/VICODIN) 5-325 MG per tablet Take 1-2 tablets by mouth every 6 (six) hours as needed for moderate pain. 06/27/13   John L Molpus, MD  lisinopril (PRINIVIL) 10 MG tablet Take 1 tablet (10 mg total) by mouth daily. 10/01/13   Shari A Upstill, PA-C  LISINOPRIL PO Take by mouth.    Historical Provider, MD  oxyCODONE-acetaminophen (PERCOCET) 5-325 MG per tablet Take 1-2 tablets by mouth every 4 (four) hours as needed. 01/25/14   Arthor Captain, PA-C  penicillin v potassium (VEETID) 500 MG tablet Take 1 tablet (500 mg total) by mouth 4 (four) times daily. 01/25/14   Abigail Harris, PA-C   BP 150/85 mmHg  Pulse 101  Temp(Src) 99.1 F (37.3 C) (Oral)  Resp 20  Ht  (1.803 m)  Wt 266 lb (120.657 kg)  BMI 37.12 kg/m2  SpO2 97% Physical Exam  Constitutional: He appears well-developed and well-nourished.  HENT:  Head: Normocephalic and atraumatic.  Multiple dental caries with no obvious sign of abscess, multiple missing teeth, no muffled voice.  Patient with moderate swelling and induration of the right lower face and right submandibular area, and the right gingiva.  No trismus.  No obvious dental abscess visualized.  Patient handling secretions well.  No sublingual tenderness or swelling.  Tongue lays flat in the mouth.    Neck:  Normal range of motion. Neck supple.  Cardiovascular: Normal rate, regular rhythm and normal heart sounds.   Pulmonary/Chest: Effort normal and breath sounds normal. No respiratory distress. He has no wheezes. He has no rales.  Musculoskeletal: Normal range of motion.  Neurological: He is alert.  Skin: Skin is warm and dry.  Psychiatric: He has a normal mood and affect.  Nursing note and vitals reviewed.   ED Course  Procedures (including critical care time) Labs Review Labs Reviewed - No  data to display  Imaging Review No results found.   EKG Interpretation None      MDM   Final diagnoses:  None   Patient presents today with facial swelling.  He had a CT scan done on 01/26/14, which showed poor dentition with numerous cavities and small/early periapical abscesses involving the upper and lower incisors and right lower canine.   He was admitted to [facility] at that time and was given IV Clindamycin.  He reports that he is taking the Clindamycin as directed.  He reports that the facial swelling has significantly improved.  He reports that his reason for returning to the ED today was to get more pain medication and also since he was already here because his mother had an appointment upstairs. He is afebrile.  No difficulty swallowing or handling secretions. No trismus.  No sublingual tenderness or swelling.  Full ROM of the neck. Therefore, doubt Ludwig's Angina.  Patient instructed to follow up with the dentist that he was referred to and continue Clindamycin.  Patient given a short course of pain medication.  Feel he is stable for discharge.  Strict return precautions given.      Santiago Glad, PA-C 02/02/14 1127  Glynn Octave, MD 02/02/14 [phone]

## 2014-01-31 NOTE — ED Notes (Addendum)
Pt requesting pain medication refill. Sts he is out of percocet. Denies [facility], has dentist follow up

## 2014-01-31 NOTE — Discharge Instructions (Signed)
°Emergency Department Resource Guide °1) Find a Doctor and Pay Out of Pocket °Although you won't have to find out who is covered by your insurance plan, it is a good idea to ask around and get recommendations. You will then need to call the office and see if the doctor you have chosen will accept you as a new patient and what types of options they offer for patients who are self-pay. Some doctors offer discounts or will set up payment plans for their patients who do not have insurance, but you will need to ask so you aren't surprised when you get to your appointment. ° °2) Contact Your Local Health Department °Not all health departments have doctors that can see patients for sick visits, but many do, so it is worth a call to see if yours does. If you don't know where your local health department is, you can check in your phone book. The CDC also has a tool to help you locate your state's health department, and many state websites also have listings of all of their local health departments. ° °3) Find a Walk-in Clinic °If your illness is not likely to be very severe or complicated, you may want to try a walk in clinic. These are popping up all over the country in pharmacies, drugstores, and shopping centers. They're usually staffed by nurse practitioners or physician assistants that have been trained to treat common illnesses and complaints. They're usually fairly quick and inexpensive. However, if you have serious medical issues or chronic medical problems, these are probably not your best option. ° °No Primary Care Doctor: °- Call [facility] at  832-8000 - they can help you locate a primary care doctor that  accepts your insurance, provides certain services, etc. °- Physician Referral Service- 1-800-533-3463 ° °Chronic Pain Problems: °Organization         Address  Phone   Notes  °[facility]  (336) 297-2271 Patients need to be referred by their primary care doctor.  ° °Medication  Assistance: °Organization         Address  Phone   Notes  °[place] [facility] [address] °[place], [place] [address] (336) 641-8030 --Must be a resident of [place] °-- Must have NO insurance coverage whatsoever (no Medicaid/ Medicare, etc.) °-- The pt. MUST have a primary care doctor that directs their care regularly and follows them in the community °  °[facility]  (866) 331-1348   °[employer]  (888) 892-1162   ° °Agencies that provide inexpensive medical care: °Organization         Address  Phone   Notes  °[facility]  (336) 832-8035   °[facility]    (336) 832-7272   °[facility] [address] °[place], [place] [address] (336) 832-4777   °[facility] [address], °[place] (336) 271-4999   °[facility]    (336) 373-0678   °[facility]    (336) 272-1050   °[facility] ° [address], [place] Phone:  (336) 832-4444, Fax:  (336) 832-4440 Hours of Operation:  9 am - 6 pm, M-F.  Also accepts Medicaid/Medicare and self-pay.  °[facility] ° [address], [place] Phone: (336) 832-3150, Fax: (336) 832-3151. Hours of Operation:  8:30 am - 5:30 pm, M-F.  Also accepts Medicaid and self-pay.  °[facility] [place] [address], [place] Phone: (336) 878-6027   °[facility] [address], [place], [place] (336)723-1848, Ext. 123 Mondays & Thursdays: 7-9 AM.  First 15 patients are seen on a first come, first serve basis. °  ° °Medicaid-accepting [place] Providers: ° °Organization         Address  Phone   Notes  °[facility] [address], [place] (336) 641-2100 Also accepts self-pay patients.  °[facility] [address], [place] ° (336) 856-9996   °[facility] [address], [place]  (336) 288-8857   °[facility] [address] [place] [address], [place] (336) 299-7000   °Veita Bland [address], [place]  ° (336) 373-1557 Only accepts [place] Access Medicaid patients after they have their name applied to their card.  ° °Self-Pay (no insurance) in [place]: ° °Organization         Address  Phone   Notes  °Sickle Cell Patients, [facility] [address], [place] (336) 832-1970   °[facility] [address], [place] (336) 832-4400   °[facility] ° [address], [place] (336) 992-4800   °[facility]/Dr. Osei-Bonsu ° [address] [place] [address], [place] or [address], [place] (336) 841-8500 Phone number for both [place] and [place] locations is the same.  °[facility] [address], [place] (336) 299-0000   °[facility] [address] [place] [address], [place] or [address] (336) 852-7530 °(336) 878-2260   °[facility] [address], [place] (336) 350-1642, phone; (336) 294-5005, fax Sees patients 1st and 3rd Saturday of every month.  Must not qualify for public or private insurance (i.e. Medicaid, Medicare, [place] Health Choice, Veterans' Benefits) • Household income should be no more than 200% of the poverty level •The clinic cannot treat you if you are pregnant or think you are pregnant • Sexually transmitted diseases are not treated at the clinic.  ° ° °Dental Care: °Organization         Address  Phone  Notes  °[place] [facility] [address], [place] (336) 641-6152 Accepts children up to age 21 who are enrolled in Medicaid or [place] Health Choice; pregnant women with a Medicaid card; and children who have applied for Medicaid or [place] Health Choice, but were declined, whose parents can pay a reduced fee at time of service.  °[place]  [facility] [place]  [address], [place] (336) 641-7733 Accepts children up to age 21 who are enrolled in Medicaid or [place] Health Choice; pregnant women with a Medicaid card; and children who have applied for Medicaid or [place] Health Choice, but were declined, whose parents can pay a reduced fee at time of service.  °[facility] ° [address], [place] (336) 641-4533 Patients are seen by appointment only. Walk-ins are not accepted. [facility] will see patients 18 years of age and older. °Monday - Tuesday (8am-5pm) °Most Wednesdays (8:30-5pm) °$30 per visit, cash only  °[facility] ° [address], [place] (336) 641-4533 Patients are seen by appointment only. Walk-ins are not accepted. [facility] will see patients 18 years of age and older. °One   Wednesday Evening (Monthly: Volunteer Based).  $30 per visit, cash only  °[facility]  (919) 537-3737 for adults; Children under age 4, call Graduate Pediatric Dentistry at ([phone]) [phone]. Children aged 4-14, please call (919) [phone] to request a pediatric application. ° Dental services are provided in all areas of dental care including fillings, crowns and bridges, complete and partial dentures, implants, gum treatment, root canals, and extractions. Preventive care is also provided. Treatment is provided to both adults and children. °Patients are selected via a lottery and there is often a waiting list. °  °[facility] [address], °[place] ° (336) 763-8833 [email or web address] °  °[facility] [address], [place], [place] (336)723-1848, Ext. 123 Second and Fourth Thursday of each month, opens at 6:30 AM; Clinic ends at 9 AM.  Patients are seen on a first-come first-served basis, and a limited number are seen during each clinic.  ° °[facility] ° [address], [place], [place] (336) 723-7904    Eligibility Requirements °You must have lived in [place], [place], or [place] counties for at least the last three months. °  You cannot be eligible for state or federal sponsored healthcare insurance, including Veterans Administration, Medicaid, or Medicare. °  You generally cannot be eligible for healthcare insurance through your employer.  °  How to apply: °Eligibility screenings are held every Tuesday and Wednesday afternoon from 1:00 pm until 4:00 pm. You do not need an appointment for the interview!  °[facility] [address], [place], [place] 336-631-2330   °[facility]  336-342-8273   °[facility]  336-703-3100   °[facility]  336-570-6415   ° °Behavioral Health Resources in the Community: °Intensive Outpatient Programs °Organization         Address  Phone  Notes  °[facility] [address], [place], [place] 336-878-6098   °[facility] [address], [place], [place] 336-832-9800   °[facility] [address], [place], [place] ° 336-882-2125   °[facility] [address],  °[place], [place] 1-800-853-5163 or 336-641-4981   °Substance Abuse Resources °Organization         Address  Phone  Notes  °[facility]  336-882-2125   °[facility]  336-784-9470   °[facility]  336-285-9073   °[facility]  336-845-3988   °Residential & Outpatient Substance Abuse Program  1-800-659-3381   °Psychological Services °Organization         Address  Phone  Notes  °[facility]  336- 832-9600   °[facility]  336- 378-7881   °[facility] [address], [place] 1-800-853-5163 or 336-641-4981   ° °Mobile Crisis Teams °Organization         Address  Phone  Notes  °[facility], Mobile Crisis Care Unit  1-877-626-1772   °Assertive °[facility] ° [address]  [place], [place] 336-834-9664   °Sharon DeEsch [address] °[place]  336-554-5454   ° °Self-Help/Support Groups °Organization         Address  Phone             Notes  °[facility] [place] - variety of support groups  336- 373-1402 Call for more information  °Narcotics Anonymous (NA), [facility] [address], °[place]   2 meetings at this location  ° °  Residential Treatment Programs °Organization         Address  Phone  Notes  °[facility] [address],    °[place]  1-866-801-8205   °[facility] ° [address], [place], [place] 704-293-8524   °[facility] [address], [place] 336-845-3988 Admissions: 8am-3pm M-F  °[facility] [address]    °[place], [place] 336-841-1104   °[facility] [address], [place], [place] 336-379-7146   °[facility] [address]  °[place], [place] 336-285-9073   °[facility] Intensive Outpatient [address], [place], [place] 336-852-3033   °[facility] [address]  °[place], [place] 1-877-615-2722 or 336-784-9470   °[facility] [address] [place], [place] 336-227-7417 Accepts Medicaid  °[facility] [address]  ° [place] 1-800-659-3381 Substance Abuse/Addiction Treatment  ° °[place] Behavioral Health Resources °Organization         Address  Phone  Notes  °[facility]  (888) 581-9988   °Julie Brannon, PhD [address] [place], [place]   (336) 349-5553 or (336) 951-0000   °[facility]   [address] °[place], [place] (336) 349-4454   °[facility] [address], [place], [place] (336) 342-8316 Insurance/Medicaid/sponsorship through [facility]  °[facility] [address]                                    [place], [place] (336) 342-8316 Therapy/tele-psych/case    °[facility] [address]  ° [place], [place] (336) 349-2233    °Dr. Arfeen  (336) 349-4544   °[facility] [place]  [employer] [place] [facility] 1) [address], [place] °2) [address], [place] °3)  [address], [place] (336) 349-3220 °(336) 342-7768 ° °(336) 342-8140   °[place] [facility] (336) 342-1394 or (336) 342-3537 (After Hours)    ° ° °

## 2014-01-31 NOTE — ED Notes (Signed)
Pt reports swelling to face and swelling to chin. Was admitted to [facility], sent home with clindamycin. Sts "its in a knot now."

## 2014-02-10 ENCOUNTER — Emergency Department (HOSPITAL_BASED_OUTPATIENT_CLINIC_OR_DEPARTMENT_OTHER)
Admission: EM | Admit: 2014-02-10 | Discharge: 2014-02-10 | Disposition: A | Discharge status: Home / Self Care | Payer: Medicaid Other | Attending: Emergency Medicine | Admitting: Emergency Medicine

## 2014-02-10 ENCOUNTER — Encounter (HOSPITAL_BASED_OUTPATIENT_CLINIC_OR_DEPARTMENT_OTHER): Payer: Self-pay | Admitting: *Deleted

## 2014-02-10 DIAGNOSIS — W108XXA Fall (on) (from) other stairs and steps, initial encounter: Secondary | ICD-10-CM | POA: Insufficient documentation

## 2014-02-10 DIAGNOSIS — H109 Unspecified conjunctivitis: Secondary | ICD-10-CM | POA: Insufficient documentation

## 2014-02-10 DIAGNOSIS — I10 Essential (primary) hypertension: Secondary | ICD-10-CM | POA: Diagnosis not present

## 2014-02-10 DIAGNOSIS — Z720 Tobacco use: Secondary | ICD-10-CM | POA: Diagnosis not present

## 2014-02-10 DIAGNOSIS — Y9289 Other specified places as the place of occurrence of the external cause: Secondary | ICD-10-CM | POA: Diagnosis not present

## 2014-02-10 DIAGNOSIS — Y9389 Activity, other specified: Secondary | ICD-10-CM | POA: Insufficient documentation

## 2014-02-10 DIAGNOSIS — Z792 Long term (current) use of antibiotics: Secondary | ICD-10-CM | POA: Insufficient documentation

## 2014-02-10 DIAGNOSIS — S39012A Strain of muscle, fascia and tendon of lower back, initial encounter: Secondary | ICD-10-CM | POA: Insufficient documentation

## 2014-02-10 DIAGNOSIS — S3992XA Unspecified injury of lower back, initial encounter: Secondary | ICD-10-CM | POA: Diagnosis present

## 2014-02-10 DIAGNOSIS — Z79899 Other long term (current) drug therapy: Secondary | ICD-10-CM | POA: Insufficient documentation

## 2014-02-10 DIAGNOSIS — Y998 Other external cause status: Secondary | ICD-10-CM | POA: Insufficient documentation

## 2014-02-10 MED ORDER — CYCLOBENZAPRINE HCL 10 MG PO TABS
10.0000 mg | ORAL_TABLET | Freq: Once | ORAL | Status: AC
  Administered 2014-02-10: 10 mg via ORAL
  Filled 2014-02-10: qty 1

## 2014-02-10 MED ORDER — ORPHENADRINE CITRATE 30 MG/ML IJ SOLN
60.0000 mg | Freq: Two times a day (BID) | INTRAMUSCULAR | Status: DC
  Filled 2014-02-10: qty 2

## 2014-02-10 MED ORDER — CYCLOBENZAPRINE HCL 10 MG PO TABS: 10.0000 mg | ORAL_TABLET | Freq: Two times a day (BID) | ORAL | Status: DC | PRN

## 2014-02-10 MED ORDER — NAPROXEN 375 MG PO TABS: 375.0000 mg | ORAL_TABLET | Freq: Two times a day (BID) | ORAL | Status: DC

## 2014-02-10 MED ORDER — KETOROLAC TROMETHAMINE 60 MG/2ML IM SOLN
60.0000 mg | Freq: Once | INTRAMUSCULAR | Status: AC
  Administered 2014-02-10: 60 mg via INTRAMUSCULAR
  Filled 2014-02-10: qty 2

## 2014-02-10 MED ORDER — ERYTHROMYCIN 5 MG/GM OP OINT
TOPICAL_OINTMENT | Freq: Once | OPHTHALMIC | Status: AC
  Administered 2014-02-10: 1 via OPHTHALMIC
  Filled 2014-02-10: qty 3.5

## 2014-02-10 NOTE — Discharge Instructions (Signed)
Back Pain, Adult Low back pain is very common. About 1 in 5 people have back pain.The cause of low back pain is rarely dangerous. The pain often gets better over time.About half of people with a sudden onset of back pain feel better in just 2 weeks. About 8 in 10 people feel better by 6 weeks.  CAUSES Some common causes of back pain include:  Strain of the muscles or ligaments supporting the spine.  Wear and tear (degeneration) of the spinal discs.  Arthritis.  Direct injury to the back. DIAGNOSIS Most of the time, the direct cause of low back pain is not known.However, back pain can be treated effectively even when the exact cause of the pain is unknown.Answering your caregiver's questions about your overall health and symptoms is one of the most accurate ways to make sure the cause of your pain is not dangerous. If your caregiver needs more information, he or she may order lab work or imaging tests (X-rays or MRIs).However, even if imaging tests show changes in your back, this usually does not require surgery. HOME CARE INSTRUCTIONS For many people, back pain returns.Since low back pain is rarely dangerous, it is often a condition that people can learn to [facility] their own.   Remain active. It is stressful on the back to sit or stand in one place. Do not sit, drive, or stand in one place for more than 30 minutes at a time. Take short walks on level surfaces as soon as pain allows.Try to increase the length of time you walk each day.  Do not stay in bed.Resting more than 1 or 2 days can delay your recovery.  Do not avoid exercise or work.Your body is made to move.It is not dangerous to be active, even though your back may hurt.Your back will likely heal faster if you return to being active before your pain is gone.  Pay attention to your body when you bend and lift. Many people have less discomfortwhen lifting if they bend their knees, keep the load close to their bodies,and  avoid twisting. Often, the most comfortable positions are those that put less stress on your recovering back.  Find a comfortable position to sleep. Use a firm mattress and lie on your side with your knees slightly bent. If you lie on your back, put a pillow under your knees.  Only take over-the-counter or prescription medicines as directed by your caregiver. Over-the-counter medicines to reduce pain and inflammation are often the most helpful.Your caregiver may prescribe muscle relaxant drugs.These medicines help dull your pain so you can more quickly return to your normal activities and healthy exercise.  Put ice on the injured area.  Put ice in a plastic bag.  Place a towel between your skin and the bag.  Leave the ice on for 15-20 minutes, 03-04 times a day for the first 2 to 3 days. After that, ice and heat may be alternated to reduce pain and spasms.  Ask your caregiver about trying back exercises and gentle massage. This may be of some benefit.  Avoid feeling anxious or stressed.Stress increases muscle tension and can worsen back pain.It is important to recognize when you are anxious or stressed and learn ways to manage it.Exercise is a great option. SEEK MEDICAL CARE IF:  You have pain that is not relieved with rest or medicine.  You have pain that does not improve in 1 week.  You have new symptoms.  You are generally not feeling well. SEEK  IMMEDIATE MEDICAL CARE IF:   You have pain that radiates from your back into your legs.  You develop new bowel or bladder control problems.  You have unusual weakness or numbness in your arms or legs.  You develop nausea or vomiting.  You develop abdominal pain.  You feel faint. Document Released: 01/14/2005 Document Revised: 07/16/2011 Document Reviewed: 05/18/2013 [facility] Patient Information 2015 [place] Center[employer]Care, [place]. This information is not intended to replace advice given to you by your health care provider. Make sure you  discuss any questions you have with your health care provider. Sty A sty (hordeolum) is an infection of a gland in the eyelid located at the base of the eyelash. A sty may develop a white or yellow head of pus. It can be puffy (swollen). Usually, the sty will burst and pus will come out on its own. They do not leave lumps in the eyelid once they drain. A sty is often confused with another form of cyst of the eyelid called a chalazion. Chalazions occur within the eyelid and not on the edge where the bases of the eyelashes are. They often are red, sore and then form firm lumps in the eyelid. CAUSES   Germs (bacteria).  Lasting (chronic) eyelid inflammation. SYMPTOMS   Tenderness, redness and swelling along the edge of the eyelid at the base of the eyelashes.  Sometimes, there is a white or yellow head of pus. It may or may not drain. DIAGNOSIS  An ophthalmologist will be able to distinguish between a sty and a chalazion and treat the condition appropriately.  TREATMENT   Styes are typically treated with warm packs (compresses) until drainage occurs.  In rare cases, medicines that kill germs (antibiotics) may be prescribed. These antibiotics may be in the form of drops, cream or pills.  If a hard lump has formed, it is generally necessary to do a small incision and remove the hardened contents of the cyst in a minor surgical procedure done in the office.  In suspicious cases, your caregiver may send the contents of the cyst to the lab to be certain that it is not a rare, but dangerous form of cancer of the glands of the eyelid. HOME CARE INSTRUCTIONS   Wash your hands often and dry them with a clean towel. Avoid touching your eyelid. This may spread the infection to other parts of the eye.  Apply heat to your eyelid for 10 to 20 minutes, several times a day, to ease pain and help to heal it faster.  Do not squeeze the sty. Allow it to drain on its own. Wash your eyelid carefully 3 to 4  times per day to remove any pus. SEEK IMMEDIATE MEDICAL CARE IF:   Your eye becomes painful or puffy (swollen).  Your vision changes.  Your sty does not drain by itself within 3 days.  Your sty comes back within a short period of time, even with treatment.  You have redness (inflammation) around the eye.  You have a fever. Document Released: 10/24/2004 Document Revised: 04/08/2011 Document Reviewed: 04/30/2013 [facility] Patient Information 2015 [place]Care, [place]. This information is not intended to replace advice given to you by your health care provider. Make sure you discuss any questions you have with your health care provider.

## 2014-02-10 NOTE — ED Provider Notes (Addendum)
CSN: [id number]     Arrival date & time 02/10/14  2207 History   None    Chief Complaint  Patient presents with  . Fall  . Eye lid swelling     (Consider location/radiation/quality/duration/timing/severity/associated sxs/prior Treatment) HPI The patient ports that he was throwing away some garbage and fell down 3 stairs. He reports that he did not hit his head or sustain other injury. He apparently got back up because he reports that the back pain started as he was throwing the trash bag with a twisting motion at the lower back. It was as he was performing that that he heard a pop in his lower back and has had pain right in the center since then. He denies any weakness or numbness into his legs. He denies any gait dysfunction. He reports he also wants his right eye checked. He reports this morning he woke up and there was some swelling in the lid. He reports it has a itchy feeling. He denies any drainage or discharge at this point time. He denies any visual changes. Past Medical History  Diagnosis Date  . Hypertension    Past Surgical History  Procedure Laterality Date  . Abdominal surgery     No family history on file. History  Substance Use Topics  . Smoking status: Current Every Day Smoker -- 0.50 packs/day    Types: Cigarettes  . Smokeless tobacco: Not on file  . Alcohol Use: Yes     Comment: occasionally    Review of Systems Constitutional: No fevers no chills no general malaise. Cardiovascular: No chest pain no shortness of breath. Neurologic: No headache no extremity weakness numbness tingling, no gait dysfunction.   Allergies  Review of patient's allergies indicates no known allergies.  Home Medications   Prior to Admission medications   Medication Sig Start Date End Date Taking? Authorizing Provider  cyclobenzaprine (FLEXERIL) 10 MG tablet Take 1 tablet (10 mg total) by mouth 2 (two) times daily as needed for muscle spasms. 02/10/14   Arby BarretteMarcy Allesandra Huebsch, MD   HYDROcodone-acetaminophen (NORCO/VICODIN) 5-325 MG per tablet Take 1 tablet by mouth every 4 (four) hours as needed. 04/27/13   Kristen N Ward, DO  HYDROcodone-acetaminophen (NORCO/VICODIN) 5-325 MG per tablet Take 1-2 tablets by mouth every 6 (six) hours as needed for moderate pain. 06/27/13   John L Molpus, MD  lisinopril (PRINIVIL) 10 MG tablet Take 1 tablet (10 mg total) by mouth daily. 10/01/13   Shari A Upstill, PA-C  LISINOPRIL PO Take by mouth.    Historical Provider, MD  naproxen (NAPROSYN) 375 MG tablet Take 1 tablet (375 mg total) by mouth 2 (two) times daily. 02/10/14   Arby BarretteMarcy Corie Vavra, MD  oxyCODONE-acetaminophen (PERCOCET/ROXICET) 5-325 MG per tablet Take 1-2 tablets by mouth every 6 (six) hours as needed for severe pain. 01/31/14   Heather Laisure, PA-C  penicillin v potassium (VEETID) 500 MG tablet Take 1 tablet (500 mg total) by mouth 4 (four) times daily. 01/25/14   Abigail Harris, PA-C   BP 156/95 mmHg  Pulse 98  Temp(Src) 98.4 F (36.9 C) (Oral)  Resp 18  Ht 5\' 11"  (1.803 m)  Wt 271 lb (122.925 kg)  BMI 37.81 kg/m2  SpO2 98% Physical Exam  Constitutional: He is oriented to person, place, and time. He appears well-developed and well-nourished.  HENT:  Head: Normocephalic and atraumatic.  Right Ear: External ear normal.  Left Ear: External ear normal.  Nose: Nose normal.  Eyes: EOM are normal. Pupils are equal,  round, and reactive to light.  Patient has a very mild conjunctival injection on the right. Pupillary reflexes are normal. Extraocular motion normal. The patient has very mild edema of the lid which appears to be localizing to a focus at the medial one third. At this time there is no clear hordeolum development. The lid however is tender at this area.  Neck: Neck supple.  Cardiovascular: Normal rate, regular rhythm, normal heart sounds and intact distal pulses.   Pulmonary/Chest: Effort normal and breath sounds normal.  Abdominal: Soft. Bowel sounds are normal. He  exhibits no distension. There is no tenderness.  Musculoskeletal: Normal range of motion. He exhibits no edema.  Neurological: He is alert and oriented to person, place, and time. He has normal strength. Coordination normal. GCS eye subscore is 4. GCS verbal subscore is 5. GCS motor subscore is 6.  Skin: Skin is warm, dry and intact.  Psychiatric: He has a normal mood and affect.    ED Course  Procedures (including critical care time) Labs Review Labs Reviewed - No data to display  Imaging Review No results found.   EKG Interpretation None      MDM   Final diagnoses:  Low back strain, initial encounter  Conjunctivitis of right eye   At this point and the patient appears to have a lower back strain. There is no associated neurologic dysfunction. He does not endorse any other injury from fall. He has secondary complaint of lid swelling appears to be early hordeolum development however there is also some mild injection of the sclera with complaint of itching consistent with a conjunctivitis. Patient be treated with rest my son ophthalmic ointment and instructions for warm compress application. The patient reports his recheck with his family doctor on Monday.    Arby Barrette, MD 02/10/14 [id number]  Arby Barrette, MD 02/10/14 [id number]

## 2014-02-10 NOTE — ED Notes (Signed)
States he fell 3 hours ago. Lower back pain from the fall. States while he is here he wants his right eye checked for infection. Eyelid is swollen.

## 2014-02-10 NOTE — ED Notes (Signed)
Pt states fell down 3 steps this pm,  C/o low back pain,  Also rt eye red and swollen x 1 day  Denies inj

## 2014-06-20 ENCOUNTER — Emergency Department (HOSPITAL_BASED_OUTPATIENT_CLINIC_OR_DEPARTMENT_OTHER)
Admission: EM | Admit: 2014-06-20 | Discharge: 2014-06-20 | Disposition: A | Discharge status: Home / Self Care | Payer: Medicaid Other | Attending: Emergency Medicine | Admitting: Emergency Medicine

## 2014-06-20 ENCOUNTER — Encounter (HOSPITAL_BASED_OUTPATIENT_CLINIC_OR_DEPARTMENT_OTHER): Payer: Self-pay | Admitting: *Deleted

## 2014-06-20 DIAGNOSIS — Z720 Tobacco use: Secondary | ICD-10-CM | POA: Insufficient documentation

## 2014-06-20 DIAGNOSIS — K029 Dental caries, unspecified: Secondary | ICD-10-CM | POA: Insufficient documentation

## 2014-06-20 DIAGNOSIS — K0381 Cracked tooth: Secondary | ICD-10-CM | POA: Insufficient documentation

## 2014-06-20 DIAGNOSIS — K088 Other specified disorders of teeth and supporting structures: Secondary | ICD-10-CM | POA: Diagnosis present

## 2014-06-20 DIAGNOSIS — Z79899 Other long term (current) drug therapy: Secondary | ICD-10-CM | POA: Diagnosis not present

## 2014-06-20 DIAGNOSIS — I10 Essential (primary) hypertension: Secondary | ICD-10-CM | POA: Insufficient documentation

## 2014-06-20 DIAGNOSIS — S025XXA Fracture of tooth (traumatic), initial encounter for closed fracture: Secondary | ICD-10-CM

## 2014-06-20 MED ORDER — AMOXICILLIN 500 MG PO CAPS: 500.0000 mg | ORAL_CAPSULE | Freq: Three times a day (TID) | ORAL | Status: DC

## 2014-06-20 MED ORDER — OXYCODONE-ACETAMINOPHEN 5-325 MG PO TABS: 1.0000 | ORAL_TABLET | Freq: Four times a day (QID) | ORAL | Status: AC | PRN

## 2014-06-20 NOTE — ED Provider Notes (Signed)
CSN: [id number]     Arrival date & time 06/20/14  1615 History   First MD Initiated Contact with Patient 06/20/14 1625     Chief Complaint  Patient presents with  . Dental Pain     (Consider location/radiation/quality/duration/timing/severity/associated sxs/prior Treatment) HPI   Patient presents to the emergency department with a dental complaint. Symptoms began 2 hours prior to arrival. The patient has tried to alleviate pain with no medication at home.  Pain rated at a 10/10, characterized as throbbing in nature and located left upper incisor. Patient denies fever, night sweats, chills, difficulty swallowing or opening mouth, SOB, nuchal rigidity or decreased ROM of neck.  Patient does not have a dentist and requests a resource guide at discharge.  Pt was eating steak when the tooth broke. He reports wide spread dental decay is due to having the "gold grill" cemented on his teeth for years when he was younger, and did not know that the teeth were rotting underneath. Reports hypertension due to pain level  Past Medical History  Diagnosis Date  . Hypertension    Past Surgical History  Procedure Laterality Date  . Abdominal surgery     History reviewed. No pertinent family history. History  Substance Use Topics  . Smoking status: Current Every Day Smoker -- 0.50 packs/day    Types: Cigarettes  . Smokeless tobacco: Not on file  . Alcohol Use: Yes     Comment: occasionally    Review of Systems  10 Systems reviewed and are negative for acute change except as noted in the HPI.    Allergies  Review of patient's allergies indicates no known allergies.  Home Medications   Prior to Admission medications   Medication Sig Start Date End Date Taking? Authorizing Provider  amoxicillin (AMOXIL) 500 MG capsule Take 1 capsule (500 mg total) by mouth 3 (three) times daily. 06/20/14   Shulem Mader Neva SeatGreene, PA-C  lisinopril (PRINIVIL) 10 MG tablet Take 1 tablet (10 mg total) by mouth daily.  10/01/13   Elpidio AnisShari Upstill, PA-C  LISINOPRIL PO Take by mouth.    Historical Provider, MD  oxyCODONE-acetaminophen (PERCOCET/ROXICET) 5-325 MG per tablet Take 1 tablet by mouth every 6 (six) hours as needed for severe pain. 06/20/14   Samika Vetsch Neva SeatGreene, PA-C   BP 160/114 mmHg  Pulse 94  Temp(Src) 98.3 F (36.8 C) (Oral)  Resp 18  Ht 5\' 11"  (1.803 m)  Wt 280 lb (127.007 kg)  BMI 39.07 kg/m2  SpO2 100% Physical Exam  Constitutional: He appears well-developed and well-nourished. No distress.  HENT:  Head: Normocephalic and atraumatic.  Mouth/Throat: Oropharynx is clear and moist. No oral lesions. No trismus in the jaw. Abnormal dentition. Dental caries present.    Eyes: Conjunctivae and EOM are normal. Pupils are equal, round, and reactive to light.  Neck: Normal range of motion. Neck supple.  Cardiovascular: Normal rate and regular rhythm.   Pulmonary/Chest: Effort normal and breath sounds normal.  Abdominal: Soft.  Neurological: He is alert.  Skin: Skin is warm and dry.  Nursing note and vitals reviewed.   ED Course  Procedures (including critical care time) Labs Review Labs Reviewed - No data to display  Imaging Review No results found.   EKG Interpretation None      MDM   Final diagnoses:  Broken tooth, closed, initial encounter   Pt declined dental block and Dycal to tooth. Referral to ENT/ oral surgeon given No emergent s/sx's present. Patent airway. No trismus.  No neck tenderness or  protrusion of tongue or floor of mouth.  Rx: pain and abx  54 y.o.Steven King's evaluation in the Emergency Department is complete. It has been determined that no acute conditions requiring further emergency intervention are present at this time. The patient/guardian have been advised of the diagnosis and plan. We have discussed signs and symptoms that warrant return to the ED, such as changes or worsening in symptoms.  Vital signs are stable at discharge. Filed Vitals:   06/20/14  1620  BP: 160/114  Pulse: 94  Temp: 98.3 F (36.8 C)  Resp: 18    Patient/guardian has voiced understanding and agreed to follow-up with the PCP or specialist.      Marlon Pel, PA-C 06/20/14 1715  Tilden Fossa, MD 06/20/14 2333

## 2014-06-20 NOTE — Discharge Instructions (Signed)
Dental Fracture °You have a dental fracture or injury. This can mean the tooth is loose, has a chip in the enamel or is broken. If just the outer enamel is chipped, there is a good chance the tooth will not become infected. The only treatment needed may be to smooth off a rough edge. Fractures into the deeper layers (dentin and pulp) cause greater pain and are more likely to become infected. These require you to see a dentist as soon as possible to save the tooth. °Loose teeth may need to be wired or bonded with a plastic splint to hold them in place. A paste may be painted on the open area of the broken tooth to reduce the pain. Antibiotics and pain medicine may be prescribed. Choosing a soft or liquid diet and rinsing the mouth out with warm water after meals may be helpful. °See your dentist as recommended. Failure to seek care or follow up with a dentist or other specialist as recommended could result in the loss of your tooth, infection, or permanent dental problems. °SEEK MEDICAL CARE IF:  °· You have increased pain not controlled with medicines. °· You have swelling around the tooth, in the face or neck. °· You have bleeding which starts, continues, or gets worse. °· You have a fever. °Document Released: 02/22/2004 Document Revised: 04/08/2011 Document Reviewed: 12/06/2008 °[employer]® Patient Information ©2015 [employer]. This information is not intended to replace advice given to you by your health care provider. Make sure you discuss any questions you have with your health care provider. ° °

## 2014-06-20 NOTE — ED Notes (Signed)
Pt reports that he broke a tooth 2 hours PTA.

## 2014-07-10 ENCOUNTER — Emergency Department (HOSPITAL_BASED_OUTPATIENT_CLINIC_OR_DEPARTMENT_OTHER)
Admission: EM | Admit: 2014-07-10 | Discharge: 2014-07-10 | Disposition: A | Discharge status: Home / Self Care | Payer: Medicaid Other | Attending: Emergency Medicine | Admitting: Emergency Medicine

## 2014-07-10 ENCOUNTER — Encounter (HOSPITAL_BASED_OUTPATIENT_CLINIC_OR_DEPARTMENT_OTHER): Payer: Self-pay | Admitting: Adult Health

## 2014-07-10 DIAGNOSIS — G8929 Other chronic pain: Secondary | ICD-10-CM | POA: Diagnosis not present

## 2014-07-10 DIAGNOSIS — Z79899 Other long term (current) drug therapy: Secondary | ICD-10-CM | POA: Diagnosis not present

## 2014-07-10 DIAGNOSIS — I10 Essential (primary) hypertension: Secondary | ICD-10-CM | POA: Diagnosis not present

## 2014-07-10 DIAGNOSIS — Z720 Tobacco use: Secondary | ICD-10-CM | POA: Insufficient documentation

## 2014-07-10 DIAGNOSIS — R609 Edema, unspecified: Secondary | ICD-10-CM

## 2014-07-10 DIAGNOSIS — Z792 Long term (current) use of antibiotics: Secondary | ICD-10-CM | POA: Insufficient documentation

## 2014-07-10 DIAGNOSIS — Z760 Encounter for issue of repeat prescription: Secondary | ICD-10-CM | POA: Diagnosis not present

## 2014-07-10 DIAGNOSIS — K088 Other specified disorders of teeth and supporting structures: Secondary | ICD-10-CM | POA: Diagnosis present

## 2014-07-10 MED ORDER — AMOXICILLIN 500 MG PO CAPS
500.0000 mg | ORAL_CAPSULE | Freq: Once | ORAL | Status: AC
  Administered 2014-07-10: 500 mg via ORAL
  Filled 2014-07-10: qty 1

## 2014-07-10 MED ORDER — AMOXICILLIN 500 MG PO CAPS: 500.0000 mg | ORAL_CAPSULE | Freq: Three times a day (TID) | ORAL | Status: DC

## 2014-07-10 MED ORDER — IBUPROFEN 800 MG PO TABS
800.0000 mg | ORAL_TABLET | Freq: Once | ORAL | Status: AC
  Administered 2014-07-10: 800 mg via ORAL
  Filled 2014-07-10: qty 1

## 2014-07-10 MED ORDER — LISINOPRIL 10 MG PO TABS
10.0000 mg | ORAL_TABLET | Freq: Once | ORAL | Status: AC
  Administered 2014-07-10: 10 mg via ORAL
  Filled 2014-07-10: qty 1

## 2014-07-10 MED ORDER — ACETAMINOPHEN 325 MG PO TABS
ORAL_TABLET | ORAL | Status: AC
  Filled 2014-07-10: qty 2

## 2014-07-10 MED ORDER — ACETAMINOPHEN 325 MG PO TABS
650.0000 mg | ORAL_TABLET | Freq: Once | ORAL | Status: AC
  Administered 2014-07-10: 650 mg via ORAL
  Filled 2014-07-10: qty 2

## 2014-07-10 MED ORDER — LISINOPRIL 10 MG PO TABS: 10.0000 mg | ORAL_TABLET | Freq: Every day | ORAL | Status: DC

## 2014-07-10 NOTE — ED Notes (Signed)
Presents with bilateral foot swelling since this am-mild edema noted, out of lisinopril and dental pain for which he was seen here last week and given percocets for.

## 2014-07-10 NOTE — ED Notes (Signed)
Patient states he is here for teeth problems,  and his feet swelling. Patient states he does not know of any insect bite, but just assumes this may have happened as reason for feet swelling. Patient also states he is out of his blood pressure medicine.

## 2014-07-10 NOTE — Discharge Instructions (Signed)
Please follow with your primary care doctor in the next 2 days for a check-up. They must obtain records for further management.  ° °Do not hesitate to return to the Emergency Department for any new, worsening or concerning symptoms.  ° °

## 2014-07-10 NOTE — ED Provider Notes (Signed)
CSN: [id number]     Arrival date & time 07/10/14  2157 History   First MD Initiated Contact with Patient 07/10/14 2221     Chief Complaint  Patient presents with  . Leg Swelling  . Dental Pain     (Consider location/radiation/quality/duration/timing/severity/associated sxs/prior Treatment) HPI  Blood pressure 150/102, pulse 95, temperature 98.3 F (36.8 C), temperature source Oral, resp. rate 20, height  (1.803 m), weight 280 lb (127.007 kg), SpO2 94 %.  Steven King is a 54 y.o. male complaining of bilateral foot swelling which she noticed this morning she also reports exacerbation of his chronic dental pain. He rates his pain at 9 out of 10. Patient was seen for similar last week and given a prescription for Percocet run out of Percocet yesterday. Patient saw his primary care physician last week, he has an appointment with a oral surgeon. Patient states that he ran out of his lisinopril unknown dosage yesterday. Denies fever, chills, facial swelling, chest pain, shortness of breath, orthopnea, PND, cough above his baseline (patient is a daily smoker) abdominal pain, nausea, vomiting.   Past Medical History  Diagnosis Date  . Hypertension    Past Surgical History  Procedure Laterality Date  . Abdominal surgery     History reviewed. No pertinent family history. History  Substance Use Topics  . Smoking status: Current Every Day Smoker -- 0.50 packs/day    Types: Cigarettes  . Smokeless tobacco: Not on file  . Alcohol Use: Yes     Comment: occasionally    Review of Systems  10 systems reviewed and found to be negative, except as noted in the HPI.   Allergies  Review of patient's allergies indicates no known allergies.  Home Medications   Prior to Admission medications   Medication Sig Start Date End Date Taking? Authorizing Provider  amoxicillin (AMOXIL) 500 MG capsule Take 1 capsule (500 mg total) by mouth 3 (three) times daily. 06/20/14   Tiffany Neva Seat, PA-C   lisinopril (PRINIVIL) 10 MG tablet Take 1 tablet (10 mg total) by mouth daily. 10/01/13   Elpidio Anis, PA-C  LISINOPRIL PO Take by mouth.    Historical Provider, MD  oxyCODONE-acetaminophen (PERCOCET/ROXICET) 5-325 MG per tablet Take 1 tablet by mouth every 6 (six) hours as needed for severe pain. 06/20/14   Tiffany Neva Seat, PA-C   BP 150/102 mmHg  Pulse 95  Temp(Src) 98.3 F (36.8 C) (Oral)  Resp 20  Ht  (1.803 m)  Wt 280 lb (127.007 kg)  BMI 39.07 kg/m2  SpO2 94% Physical Exam  Constitutional: He is oriented to person, place, and time. He appears well-developed and well-nourished. No distress.  HENT:  Head: Normocephalic and atraumatic.  Mouth/Throat: Oropharynx is clear and moist.  Generally poor dentition, no gingival swelling, erythema or tenderness to palpation. Patient is handling their secretions. There is no tenderness to palpation or firmness underneath tongue bilaterally. No trismus.    Eyes: Conjunctivae and EOM are normal. Pupils are equal, round, and reactive to light.  Neck: Normal range of motion. Neck supple.  Cardiovascular: Normal rate, regular rhythm and intact distal pulses.   Pulmonary/Chest: Effort normal and breath sounds normal. No stridor. No respiratory distress. He has no wheezes. He has no rales. He exhibits no tenderness.  Abdominal: Soft. Bowel sounds are normal. He exhibits no distension and no mass. There is no tenderness. There is no rebound and no guarding.  Musculoskeletal: Normal range of motion.  Trace bilateral pitting edema 1+ to ankles.  No calf asymmetry, Homans sign is negative.  Lymphadenopathy:    He has no cervical adenopathy.  Neurological: He is alert and oriented to person, place, and time.  Psychiatric: He has a normal mood and affect.  Nursing note and vitals reviewed.   ED Course  Procedures (including critical care time) Labs Review Labs Reviewed - No data to display  Imaging Review No results found.   EKG  Interpretation None      MDM   Final diagnoses:  Chronic pain  Medication refill  Dependent edema   Filed Vitals:   07/10/14 2209  BP: 150/102  Pulse: 95  Temp: 98.3 F (36.8 C)  TempSrc: Oral  Resp: 20  Height: 5\' 11"  (1.803 m)  Weight: 280 lb (127.007 kg)  SpO2: 94%    Medications  ibuprofen (ADVIL,MOTRIN) tablet 800 mg (not administered)  acetaminophen (TYLENOL) tablet 650 mg (not administered)  amoxicillin (AMOXIL) capsule 500 mg (not administered)  lisinopril (PRINIVIL,ZESTRIL) tablet 10 mg (not administered)    Steven King is a pleasant 54 y.o. male presenting with bilateral pedal edema requesting refill of Percocet for his chronic dental pain. Patient also has ran out of his Cipro, saw his primary care doctor last week but did not ask for refill. Blood pressure is elevated at 150/102. Patient denies chest pain, shortness of breath, PND, orthopnea. I've advised him that he will need to follow closely with his primary care on swelling, advised him to elevate legs above the level of the heart is much as possible. I've advised patient that we will no longer give him narcotics for his chronic dental pain. Chart review shows this patient has multiple visits for similar, he has primary care and dental follow-up. He will need to have this chronic pain managed as an outpatient.  Discussed case with attending physician who agrees with care plan and disposition.   Evaluation does not show pathology that would require ongoing emergent intervention or inpatient treatment. Pt is hemodynamically stable and mentating appropriately. Discussed findings and plan with patient/guardian, who agrees with care plan. All questions answered. Return precautions discussed and outpatient follow up given.   New Prescriptions   AMOXICILLIN (AMOXIL) 500 MG CAPSULE    Take 1 capsule (500 mg total) by mouth 3 (three) times daily.   LISINOPRIL (PRINIVIL,ZESTRIL) 10 MG TABLET    Take 1 tablet (10 mg  total) by mouth daily.       Wynetta Emery, PA-C 07/10/14 2245  Wynetta Emery, PA-C 07/10/14 2245  Nelva Nay, MD 07/24/14 [phone]

## 2014-11-30 ENCOUNTER — Encounter (HOSPITAL_BASED_OUTPATIENT_CLINIC_OR_DEPARTMENT_OTHER): Payer: Self-pay | Admitting: *Deleted

## 2014-11-30 ENCOUNTER — Emergency Department (HOSPITAL_BASED_OUTPATIENT_CLINIC_OR_DEPARTMENT_OTHER): Payer: Medicaid Other

## 2014-11-30 ENCOUNTER — Emergency Department (HOSPITAL_BASED_OUTPATIENT_CLINIC_OR_DEPARTMENT_OTHER)
Admission: EM | Admit: 2014-11-30 | Discharge: 2014-12-01 | Disposition: A | Discharge status: Home / Self Care | Payer: Medicaid Other | Attending: Emergency Medicine | Admitting: Emergency Medicine

## 2014-11-30 DIAGNOSIS — J690 Pneumonitis due to inhalation of food and vomit: Secondary | ICD-10-CM | POA: Diagnosis not present

## 2014-11-30 DIAGNOSIS — R61 Generalized hyperhidrosis: Secondary | ICD-10-CM | POA: Insufficient documentation

## 2014-11-30 DIAGNOSIS — R000 Tachycardia, unspecified: Secondary | ICD-10-CM | POA: Insufficient documentation

## 2014-11-30 DIAGNOSIS — I10 Essential (primary) hypertension: Secondary | ICD-10-CM | POA: Insufficient documentation

## 2014-11-30 DIAGNOSIS — R0602 Shortness of breath: Secondary | ICD-10-CM | POA: Diagnosis present

## 2014-11-30 DIAGNOSIS — Z720 Tobacco use: Secondary | ICD-10-CM | POA: Insufficient documentation

## 2014-11-30 LAB — CBC WITH DIFFERENTIAL/PLATELET
Basophils Absolute: 0.1 10*3/uL (ref 0.0–0.1)
Basophils Relative: 1 %
EOS ABS: 0.3 10*3/uL (ref 0.0–0.7)
Eosinophils Relative: 4 %
HCT: 45.8 % (ref 39.0–52.0)
HEMOGLOBIN: 15.1 g/dL (ref 13.0–17.0)
LYMPHS PCT: 37 %
Lymphs Abs: 3.2 10*3/uL (ref 0.7–4.0)
MCH: 28.2 pg (ref 26.0–34.0)
MCHC: 33 g/dL (ref 30.0–36.0)
MCV: 85.4 fL (ref 78.0–100.0)
Monocytes Absolute: 0.8 10*3/uL (ref 0.1–1.0)
Monocytes Relative: 9 %
Neutro Abs: 4.4 10*3/uL (ref 1.7–7.7)
Neutrophils Relative %: 49 %
PLATELETS: 290 10*3/uL (ref 150–400)
RBC: 5.36 MIL/uL (ref 4.22–5.81)
RDW: 14.6 % (ref 11.5–15.5)
WBC: 8.7 10*3/uL (ref 4.0–10.5)

## 2014-11-30 LAB — COMPREHENSIVE METABOLIC PANEL
ALT: 41 U/L (ref 17–63)
AST: 53 U/L — AB (ref 15–41)
Albumin: 3.9 g/dL (ref 3.5–5.0)
Alkaline Phosphatase: 91 U/L (ref 38–126)
Anion gap: 8 (ref 5–15)
BUN: 15 mg/dL (ref 6–20)
CALCIUM: 8.9 mg/dL (ref 8.9–10.3)
CHLORIDE: 106 mmol/L (ref 101–111)
CO2: 24 mmol/L (ref 22–32)
CREATININE: 1.27 mg/dL — AB (ref 0.61–1.24)
GFR calc Af Amer: >60 mL/min (ref >60)
GFR calc non Af Amer: >60 mL/min (ref >60)
GLUCOSE: 140 mg/dL — AB (ref 65–99)
Potassium: 4.1 mmol/L (ref 3.5–5.1)
SODIUM: 138 mmol/L (ref 135–145)
Total Bilirubin: 0.6 mg/dL (ref 0.3–1.2)
Total Protein: 7.3 g/dL (ref 6.5–8.1)

## 2014-11-30 LAB — D-DIMER, QUANTITATIVE: D-Dimer, Quant: <0.27 ug/mL-FEU (ref 0.00–0.48)

## 2014-11-30 MED ORDER — SODIUM CHLORIDE 0.9 % IV BOLUS (SEPSIS)
1000.0000 mL | Freq: Once | INTRAVENOUS | Status: AC
Start: 2014-11-30 — End: 2014-12-01
  Administered 2014-11-30: 1000 mL via INTRAVENOUS

## 2014-11-30 MED ORDER — ALBUTEROL SULFATE (2.5 MG/3ML) 0.083% IN NEBU
2.5000 mg | INHALATION_SOLUTION | Freq: Once | RESPIRATORY_TRACT | Status: AC
  Administered 2014-11-30: 2.5 mg via RESPIRATORY_TRACT
  Filled 2014-11-30: qty 3

## 2014-11-30 MED ORDER — LEVOFLOXACIN 750 MG PO TABS: 750.0000 mg | ORAL_TABLET | Freq: Every day | ORAL | Status: DC

## 2014-11-30 MED ORDER — PREDNISONE 20 MG PO TABS: ORAL_TABLET | ORAL | Status: AC

## 2014-11-30 MED ORDER — FENTANYL CITRATE (PF) 100 MCG/2ML IJ SOLN
100.0000 ug | Freq: Once | INTRAMUSCULAR | Status: AC
  Administered 2014-11-30: 100 ug via INTRAVENOUS
  Filled 2014-11-30: qty 2

## 2014-11-30 MED ORDER — ALBUTEROL SULFATE HFA 108 (90 BASE) MCG/ACT IN AERS
2.0000 | INHALATION_SPRAY | Freq: Once | RESPIRATORY_TRACT | Status: AC
  Administered 2014-12-01: 2 via RESPIRATORY_TRACT
  Filled 2014-11-30: qty 6.7

## 2014-11-30 MED ORDER — IPRATROPIUM-ALBUTEROL 0.5-2.5 (3) MG/3ML IN SOLN
3.0000 mL | Freq: Once | RESPIRATORY_TRACT | Status: AC
  Administered 2014-11-30: 3 mL via RESPIRATORY_TRACT
  Filled 2014-11-30: qty 3

## 2014-11-30 MED ORDER — LISINOPRIL 10 MG PO TABS: 10.0000 mg | ORAL_TABLET | Freq: Every day | ORAL | Status: AC

## 2014-11-30 MED ORDER — LEVOFLOXACIN IN D5W 500 MG/100ML IV SOLN
500.0000 mg | Freq: Once | INTRAVENOUS | Status: AC
  Administered 2014-11-30: 500 mg via INTRAVENOUS
  Filled 2014-11-30: qty 100

## 2014-11-30 NOTE — ED Notes (Addendum)
Pt c/o cough and SOB with fevers x 2 weeks, Pt states is out of  lisinipril

## 2014-11-30 NOTE — ED Notes (Signed)
MD at bedside. 

## 2014-11-30 NOTE — ED Provider Notes (Signed)
CSN: [id number]     Arrival date & time 11/30/14  1930 History  By signing my name below, I, Phillis Haggis, attest that this documentation has been prepared under the direction and in the presence of Marily Memos, MD. Electronically Signed: Phillis Haggis, ED Scribe. 11/30/2014. 10:36 PM.   Chief Complaint  Patient presents with  . Shortness of Breath   The history is provided by the patient. No language interpreter was used.  HPI Comments: Steven King is a 54 y.o. male with a hx of HTN and pneumonia who presents to the Emergency Department complaining of productive cough with gray sputum, and SOB onset 2 weeks ago. He reports associated diaphoresis, subjective fever, chills and chest pain that worsens with coughing. He states that this feels like his past hx of pneumonia and that "my lungs are slapping around like fish." Pt states that he ran out of Lisinopril today. He denies hx of COPD, leg swelling, appetite change, or recent long travel. He reports smoking .5 ppd.   Past Medical History  Diagnosis Date  . Hypertension    Past Surgical History  Procedure Laterality Date  . Abdominal surgery     History reviewed. No pertinent family history. Social History  Substance Use Topics  . Smoking status: Current Every Day Smoker -- 0.50 packs/day    Types: Cigarettes  . Smokeless tobacco: None  . Alcohol Use: Yes     Comment: occasionally    Review of Systems  Constitutional: Positive for fever, chills and diaphoresis. Negative for appetite change.  Respiratory: Positive for cough and shortness of breath.   Cardiovascular: Positive for chest pain. Negative for leg swelling.  All other systems reviewed and are negative.     Allergies  Review of patient's allergies indicates no known allergies.  Home Medications   Prior to Admission medications   Medication Sig Start Date End Date Taking? Authorizing Provider  levofloxacin (LEVAQUIN) 750 MG tablet Take 1 tablet (750 mg total)  by mouth daily. X 7 days 11/30/14   Marily Memos, MD  lisinopril (PRINIVIL,ZESTRIL) 10 MG tablet Take 1 tablet (10 mg total) by mouth daily. 11/30/14   Marily Memos, MD  oxyCODONE-acetaminophen (PERCOCET/ROXICET) 5-325 MG per tablet Take 1 tablet by mouth every 6 (six) hours as needed for severe pain. 06/20/14   Tiffany Neva Seat, PA-C  predniSONE (DELTASONE) 20 MG tablet 3 tabs po day one, then 2 tabs daily x 4 days 11/30/14   Marily Memos, MD   BP 165/93 mmHg  Pulse 102  Temp(Src) 99.7 F (37.6 C) (Oral)  Resp 18  Ht  (1.803 m)  Wt 278 lb (126.1 kg)  BMI 38.79 kg/m2  SpO2 97%  Physical Exam  Constitutional: He is oriented to person, place, and time. He appears well-developed and well-nourished.  Coughing up white sputum on exam  HENT:  Head: Normocephalic and atraumatic.  Eyes: EOM are normal. Pupils are equal, round, and reactive to light.  Neck: Normal range of motion. Neck supple.  Cardiovascular: Regular rhythm and normal heart sounds.  Tachycardia present.  Exam reveals no gallop and no friction rub.   No murmur heard. Pulmonary/Chest: Effort normal and breath sounds normal. He has no wheezes.  Abdominal: Soft. There is no tenderness.  Musculoskeletal: Normal range of motion. He exhibits no edema or tenderness.  Neurological: He is alert and oriented to person, place, and time.  Skin: Skin is warm. He is diaphoretic.  Psychiatric: He has a normal mood and affect. His behavior  is normal.  Nursing note and vitals reviewed.   ED Course  Procedures (including critical care time) DIAGNOSTIC STUDIES: Oxygen Saturation is 97% on RA, normal by my interpretation.    COORDINATION OF CARE: 9:45 PM-Discussed treatment plan which includes labs, x-ray, and cough medication with pt at bedside and pt agreed to plan.   Labs Review Labs Reviewed  COMPREHENSIVE METABOLIC PANEL - Abnormal; Notable for the following:    Glucose, Bld 140 (*)    Creatinine, Ser 1.27 (*)    AST 53 (*)     All other components within normal limits  CBC WITH DIFFERENTIAL/PLATELET  D-DIMER, QUANTITATIVE (NOT [facility])    Imaging Review Dg Chest 2 View  11/30/2014  CLINICAL DATA:  Cough and chest congestion. Shortness of breath for 2 weeks. EXAM: CHEST  2 VIEW COMPARISON:  01/26/2014 FINDINGS: Pleural-parenchymal scarring seen in left lung base. Old left lateral sixth rib fracture deformity noted. No evidence of pneumothorax or pleural effusion. No evidence of pulmonary infiltrate or edema. Heart size and mediastinal contours are within normal limits. IMPRESSION: No active cardiopulmonary disease. Electronically Signed   By: Myles RosenthalJohn  Stahl M.D.   On: 11/30/2014 20:42   I have personally reviewed and evaluated these images and lab results as part of my medical decision-making.   EKG Interpretation None      MDM   Final diagnoses:  Aspiration pneumonia, unspecified aspiration pneumonia type, unspecified laterality, unspecified part of lung (HCC)    54 year old male here with bronchitis versus pneumonia. Improved with albuterol. Had one transient episode of hypoxia 89% however this is when he was sleeping and he states he's had issues with hypoxia when sleeping prior. otherwise is stable. Will dc on abx.  I have personally and contemperaneously reviewed labs and imaging and used in my decision making as above.   A medical screening exam was performed and I feel the patient has had an appropriate workup for their chief complaint at this time and likelihood of emergent condition existing is low. They have been counseled on decision, discharge, follow up and which symptoms necessitate immediate return to the emergency department. They or their family verbally stated understanding and agreement with plan and discharged in stable condition.    I personally performed the services described in this documentation, which was scribed in my presence. The recorded information has been reviewed and is  accurate.    Marily MemosJason Tangala Wiegert, MD 12/01/14 2236

## 2014-11-30 NOTE — ED Notes (Signed)
Nurse first-pt NAD-O2sat 97%-HR 95-RR 20

## 2014-12-20 ENCOUNTER — Emergency Department (HOSPITAL_BASED_OUTPATIENT_CLINIC_OR_DEPARTMENT_OTHER)
Admission: EM | Admit: 2014-12-20 | Discharge: 2014-12-20 | Disposition: A | Discharge status: Home / Self Care | Payer: Medicaid Other | Attending: Emergency Medicine | Admitting: Emergency Medicine

## 2014-12-20 ENCOUNTER — Encounter (HOSPITAL_BASED_OUTPATIENT_CLINIC_OR_DEPARTMENT_OTHER): Payer: Self-pay

## 2014-12-20 ENCOUNTER — Emergency Department (HOSPITAL_BASED_OUTPATIENT_CLINIC_OR_DEPARTMENT_OTHER): Payer: Medicaid Other

## 2014-12-20 DIAGNOSIS — Z79899 Other long term (current) drug therapy: Secondary | ICD-10-CM | POA: Insufficient documentation

## 2014-12-20 DIAGNOSIS — F17210 Nicotine dependence, cigarettes, uncomplicated: Secondary | ICD-10-CM | POA: Diagnosis not present

## 2014-12-20 DIAGNOSIS — J209 Acute bronchitis, unspecified: Secondary | ICD-10-CM

## 2014-12-20 DIAGNOSIS — I10 Essential (primary) hypertension: Secondary | ICD-10-CM | POA: Diagnosis not present

## 2014-12-20 DIAGNOSIS — R0602 Shortness of breath: Secondary | ICD-10-CM | POA: Diagnosis present

## 2014-12-20 MED ORDER — ALBUTEROL SULFATE (2.5 MG/3ML) 0.083% IN NEBU
INHALATION_SOLUTION | RESPIRATORY_TRACT | Status: AC
  Administered 2014-12-20: 5 mg via RESPIRATORY_TRACT
  Filled 2014-12-20: qty 6

## 2014-12-20 MED ORDER — HYDROCOD POLST-CPM POLST ER 10-8 MG/5ML PO SUER: 5.0000 mL | Freq: Two times a day (BID) | ORAL | Status: DC | PRN

## 2014-12-20 MED ORDER — IPRATROPIUM BROMIDE 0.02 % IN SOLN
RESPIRATORY_TRACT | Status: AC
  Administered 2014-12-20: 0.5 mg via RESPIRATORY_TRACT
  Filled 2014-12-20: qty 2.5

## 2014-12-20 MED ORDER — ALBUTEROL SULFATE (2.5 MG/3ML) 0.083% IN NEBU
5.0000 mg | INHALATION_SOLUTION | Freq: Once | RESPIRATORY_TRACT | Status: AC
  Administered 2014-12-20: 5 mg via RESPIRATORY_TRACT

## 2014-12-20 MED ORDER — ALBUTEROL SULFATE (2.5 MG/3ML) 0.083% IN NEBU
5.0000 mg | INHALATION_SOLUTION | Freq: Once | RESPIRATORY_TRACT | Status: AC
  Administered 2014-12-20: 5 mg via RESPIRATORY_TRACT
  Filled 2014-12-20: qty 6

## 2014-12-20 MED ORDER — IBUPROFEN 800 MG PO TABS
800.0000 mg | ORAL_TABLET | Freq: Once | ORAL | Status: AC
  Administered 2014-12-20: 800 mg via ORAL
  Filled 2014-12-20: qty 1

## 2014-12-20 MED ORDER — DOXYCYCLINE HYCLATE 100 MG PO CAPS: 100.0000 mg | ORAL_CAPSULE | Freq: Two times a day (BID) | ORAL | Status: DC

## 2014-12-20 MED ORDER — FLUTICASONE PROPIONATE HFA 44 MCG/ACT IN AERO
2.0000 | INHALATION_SPRAY | Freq: Two times a day (BID) | RESPIRATORY_TRACT | Status: DC
  Administered 2014-12-20: 2 via RESPIRATORY_TRACT
  Filled 2014-12-20: qty 10.6

## 2014-12-20 MED ORDER — DOXYCYCLINE HYCLATE 100 MG PO TABS
100.0000 mg | ORAL_TABLET | Freq: Once | ORAL | Status: AC
  Administered 2014-12-20: 100 mg via ORAL
  Filled 2014-12-20: qty 1

## 2014-12-20 MED ORDER — IPRATROPIUM BROMIDE 0.02 % IN SOLN
0.5000 mg | Freq: Once | RESPIRATORY_TRACT | Status: AC
  Administered 2014-12-20: 0.5 mg via RESPIRATORY_TRACT

## 2014-12-20 NOTE — ED Provider Notes (Signed)
CSN: [id number]     Arrival date & time 12/20/14  0453 History   First MD Initiated Contact with Patient 12/20/14 [phone]     Chief Complaint  Patient presents with  . Shortness of Breath     (Consider location/radiation/quality/duration/timing/severity/associated sxs/prior Treatment) HPI  This is a 54 year old male who was diagnosed with pneumonia 3 weeks ago. He was treated with Levaquin and given an albuterol inhaler. He returns with dyspnea and wheezing that worsened overnight. Symptoms are moderate to severe. He has not been using his albuterol inhaler. He denies fever or chills. He has had a productive cough with brown sputum. He states his chest is sore from coughing. Respiratory therapy found him to have decreased air movement with significant wheezing on arrival and administered an albuterol and Atrovent neb treatment before my evaluation with improvement. He has been having some diarrhea since taking his Levaquin.  Past Medical History  Diagnosis Date  . Hypertension    Past Surgical History  Procedure Laterality Date  . Abdominal surgery     No family history on file. Social History  Substance Use Topics  . Smoking status: Current Every Day Smoker -- 0.50 packs/day    Types: Cigarettes  . Smokeless tobacco: None  . Alcohol Use: Yes     Comment: occasionally    Review of Systems  All other systems reviewed and are negative.   Allergies  Review of patient's allergies indicates no known allergies.  Home Medications   Prior to Admission medications   Medication Sig Start Date End Date Taking? Authorizing Provider  levofloxacin (LEVAQUIN) 750 MG tablet Take 1 tablet (750 mg total) by mouth daily. X 7 days 11/30/14   Marily MemosJason Mesner, MD  lisinopril (PRINIVIL,ZESTRIL) 10 MG tablet Take 1 tablet (10 mg total) by mouth daily. 11/30/14   Marily MemosJason Mesner, MD  oxyCODONE-acetaminophen (PERCOCET/ROXICET) 5-325 MG per tablet Take 1 tablet by mouth every 6 (six) hours as needed for  severe pain. 06/20/14   Tiffany Neva SeatGreene, PA-C  predniSONE (DELTASONE) 20 MG tablet 3 tabs po day one, then 2 tabs daily x 4 days 11/30/14   Marily MemosJason Mesner, MD   BP 154/95 mmHg  Pulse 102  Temp(Src) 97.9 F (36.6 C) (Oral)  Resp 22  Ht 5\' 11"  (1.803 m)  Wt 270 lb (122.471 kg)  BMI 37.67 kg/m2  SpO2 94%   Physical Exam General: Well-developed, well-nourished male in no acute distress; appearance consistent with age of record HENT: normocephalic; atraumatic Eyes: pupils equal, round and reactive to light; extraocular muscles intact; arcus senilis bilaterally Neck: supple Heart: regular rate and rhythm; tachycardia Lungs: Expiratory wheezes Abdomen: soft; nondistended; nontender; bowel sounds present Extremities: No deformity; full range of motion; pulses normal Neurologic: Awake, alert and oriented; motor function intact in all extremities and symmetric; no facial droop Skin: Warm and dry Psychiatric: Normal mood and affect    ED Course  Procedures (including critical care time)   MDM  Nursing notes and vitals signs, including pulse oximetry, reviewed.  Summary of this visit's results, reviewed by myself:  Imaging Studies: Dg Chest 2 View  12/20/2014  CLINICAL DATA:  Acute onset of generalized chest pain with inspiration. Productive cough and shortness of breath. Initial encounter. EXAM: CHEST  2 VIEW COMPARISON:  Chest radiograph performed 11/30/2014 FINDINGS: The lungs are well-aerated. Minimal left basilar atelectasis is noted. There is no evidence of pleural effusion or pneumothorax. The heart is normal in size; the mediastinal contour is within normal limits. No acute osseous  abnormalities are seen. IMPRESSION: Minimal left basilar atelectasis noted.  Lungs otherwise clear. Electronically Signed   By: Roanna Raider M.D.   On: 12/20/2014 05:39   6:07 AM Lungs clear after second albuterol treatment. We will start him on an inhaled steroid and reemphasized the need to use his  albuterol inhaler. He was also advised of the benefit of quitting smoking. Suspect patient has early COPD.   Paula Libra, MD 12/20/14 [phone]

## 2014-12-20 NOTE — ED Notes (Signed)
Pt c/o productive cough with brown sputum, SOB and pain on inspiration from coughing

## 2015-02-20 ENCOUNTER — Encounter (HOSPITAL_BASED_OUTPATIENT_CLINIC_OR_DEPARTMENT_OTHER): Payer: Self-pay

## 2015-02-20 ENCOUNTER — Emergency Department (HOSPITAL_BASED_OUTPATIENT_CLINIC_OR_DEPARTMENT_OTHER)
Admission: EM | Admit: 2015-02-20 | Discharge: 2015-02-20 | Disposition: A | Discharge status: Home / Self Care | Payer: Medicaid Other | Attending: Emergency Medicine | Admitting: Emergency Medicine

## 2015-02-20 DIAGNOSIS — K0889 Other specified disorders of teeth and supporting structures: Secondary | ICD-10-CM | POA: Diagnosis present

## 2015-02-20 DIAGNOSIS — I10 Essential (primary) hypertension: Secondary | ICD-10-CM | POA: Diagnosis not present

## 2015-02-20 DIAGNOSIS — K029 Dental caries, unspecified: Secondary | ICD-10-CM | POA: Insufficient documentation

## 2015-02-20 DIAGNOSIS — K0381 Cracked tooth: Secondary | ICD-10-CM | POA: Diagnosis not present

## 2015-02-20 DIAGNOSIS — Z79899 Other long term (current) drug therapy: Secondary | ICD-10-CM | POA: Diagnosis not present

## 2015-02-20 DIAGNOSIS — F17210 Nicotine dependence, cigarettes, uncomplicated: Secondary | ICD-10-CM | POA: Insufficient documentation

## 2015-02-20 MED ORDER — PENICILLIN V POTASSIUM 500 MG PO TABS: 500.0000 mg | ORAL_TABLET | Freq: Four times a day (QID) | ORAL | Status: AC

## 2015-02-20 MED FILL — PENICILLIN VK 500 MG TABLET: 500 | 10 days supply | Qty: 40 | Fill #0

## 2015-02-20 NOTE — ED Notes (Signed)
PA at bedside.

## 2015-02-20 NOTE — ED Provider Notes (Signed)
CSN: [id number]     Arrival date & time 02/20/15  1537 History   First MD Initiated Contact with Patient 02/20/15 1557     Chief Complaint  Patient presents with  . Dental Pain     (Consider location/radiation/quality/duration/timing/severity/associated sxs/prior Treatment) HPI Steven King is a 55 y.o. male history of hypertension who comes in for evaluation of dental pain. Patient reports he has grossly poor dentition and has had fractured teeth in the past. He reports gradual onset of lower right dental pain starting 2 days ago. He has not tried anything to improve his symptoms. Chewing worsens his discomfort. Denies any fevers, chills, difficulty swallowing or breathing, sore throat. No other modifying factors  Past Medical History  Diagnosis Date  . Hypertension    Past Surgical History  Procedure Laterality Date  . Abdominal surgery     No family history on file. Social History  Substance Use Topics  . Smoking status: Current Every Day Smoker -- 0.50 packs/day    Types: Cigarettes  . Smokeless tobacco: None  . Alcohol Use: Yes     Comment: occasionally    Review of Systems A 10 point review of systems was completed and was negative except for pertinent positives and negatives as mentioned in the history of present illness     Allergies  Review of patient's allergies indicates no known allergies.  Home Medications   Prior to Admission medications   Medication Sig Start Date End Date Taking? Authorizing Provider  lisinopril (PRINIVIL,ZESTRIL) 10 MG tablet Take 1 tablet (10 mg total) by mouth daily. 11/30/14   Marily Memos, MD  oxyCODONE-acetaminophen (PERCOCET/ROXICET) 5-325 MG per tablet Take 1 tablet by mouth every 6 (six) hours as needed for severe pain. 06/20/14   Marlon Pel, PA-C  penicillin v potassium (VEETID) 500 MG tablet Take 1 tablet (500 mg total) by mouth 4 (four) times daily. 02/20/15 02/27/15  Joycie Peek, PA-C  predniSONE (DELTASONE) 20 MG tablet  3 tabs po day one, then 2 tabs daily x 4 days 11/30/14   Marily Memos, MD   BP 173/112 mmHg  Pulse 94  Temp(Src) 98.9 F (37.2 C) (Oral)  Resp 20  Ht  (1.803 m)  Wt 126.1 kg  BMI 38.79 kg/m2  SpO2 96% Physical Exam  Constitutional: He is oriented to person, place, and time. He appears well-developed and well-nourished.  HENT:  Head: Normocephalic and atraumatic.  Mouth/Throat: Oropharynx is clear and moist.  Discomfort located to right mandibular canine and premolars. Overall poor dentition. Multiple missing teeth, fractured teeth with active caries. Mucous membranes are moist. No unilateral tonsillar swelling, uvula midline, no glossal swelling or elevation. No trismus. No fluctuance or evidence of a drainable abscess. No other evidence of emergent infection, Retropharyngeal or Peritonsillar abscess, Ludwig or Vincents angina. Tolerating secretions well. Patent airway   Eyes: Conjunctivae are normal. Pupils are equal, round, and reactive to light. Right eye exhibits no discharge. Left eye exhibits no discharge. No scleral icterus.  Neck: Normal range of motion. Neck supple.  Pulmonary/Chest: Effort normal. No respiratory distress.  Musculoskeletal: Normal range of motion. He exhibits no tenderness.  Neurological: He is alert and oriented to person, place, and time.  Cranial Nerves II-XII grossly intact  Skin: Skin is warm and dry. No rash noted.  Psychiatric: He has a normal mood and affect.  Nursing note and vitals reviewed.   ED Course  Procedures (including critical care time) Labs Review Labs Reviewed - No data to display  Imaging Review No results found. I have personally reviewed and evaluated these images and lab results as part of my medical decision-making.   EKG Interpretation None     Meds given in ED:  Medications - No data to display  New Prescriptions   PENICILLIN V POTASSIUM (VEETID) 500 MG TABLET    Take 1 tablet (500 mg total) by mouth 4 (four)  times daily.   Filed Vitals:   02/20/15 1546  BP: 173/112  Pulse: 94  Temp: 98.9 F (37.2 C)  TempSrc: Oral  Resp: 20  Height:  (1.803 m)  Weight: 126.1 kg  SpO2: 96%    MDM  Here for evaluation of dental pain. On exam, there is no evidence of a drainable abscess. No trismus, glossal elevation, unilateral tonsillar swelling. No evidence of retropharyngeal or peritonsillar abscess or Ludwig angina. Patient refuses dental block. Discharged with outpatient dental resources as well as referral to on-call dentistry. Also given prescription for antibiotics, refuses anti-inflammatories. Overall, appears well, nontoxic and appropriate for discharge. Patient does have elevated blood pressure but denies any headache, vision changes, chest pain, short of breath, other urinary changes. No evidence of hypertensive emergency. Discussed will need follow-up with PCP for further blood pressure medication reconciliation.  Final diagnoses:  Pain, dental        Joycie Peek, PA-C 02/21/15 1015  Jerelyn Scott, MD 02/23/15 [phone]

## 2015-02-20 NOTE — Discharge Instructions (Signed)
Please take your antibiotics as prescribed. Do not save or share this medication. He may take Motrin or Tylenol for your discomfort. Follow-up with dentistry for definitive care. Return to ED for new or worsening symptoms as we discussed.  [facility]  Community Service Learning [facility]  [address]  [place], [place] [address]  Phone [phone]  The [employer] [facility] in [place], [place], exemplifies the [employer] vision to improve the health and quality of life of all [place]ians by Public house manager with a passion to care for the underserved and by leading the nation in community-based, service learning oral health education. We are committed to offering comprehensive general dental services for adults, children and special needs patients in a safe, caring and professional setting.  Appointments: Our clinic is open Monday through Friday 8:00 a.m. until 5:00 p.m. The amount of time scheduled for an appointment depends on the patients specific needs. We ask that you keep your appointed time for care or provide 24-hour notice of all appointment changes. Parents or legal guardians must accompany minor children.  Payment for Services: Medicaid and other insurance plans are welcome. Payment for services is due when services are rendered and may be made by cash or credit card. If you have dental insurance, we will assist you with your claim submission.   Emergencies: Emergency services will be provided Monday through Friday on a walk-in basis. Please arrive early for emergency services. After hours emergency services will be provided for patients of record as required.  Services:  Comprehensive General Dentistry  Childrens Dentistry  Oral Surgery - Extractions  Root Canals  Sealants and Tooth Colored Fillings  Crowns and Bridges  Dentures and Partial  Dentures  Implant Services  Periodontal Services and Agricultural engineer  3-D/Cone Beam Imaging    Emergency Department Resource Guide 1) Find a Doctor and Pay Out of Pocket Although you won't have to find out who is covered by your insurance plan, it is a good idea to ask around and get recommendations. You will then need to call the office and see if the doctor you have chosen will accept you as a new patient and what types of options they offer for patients who are self-pay. Some doctors offer discounts or will set up payment plans for their patients who do not have insurance, but you will need to ask so you aren't surprised when you get to your appointment.  2) Contact Your Local Health Department Not all health departments have doctors that can see patients for sick visits, but many do, so it is worth a call to see if yours does. If you don't know where your local health department is, you can check in your phone book. The CDC also has a tool to help you locate your state's health department, and many state websites also have listings of all of their local health departments.  3) Find a Walk-in Clinic If your illness is not likely to be very severe or complicated, you may want to try a walk in clinic. These are popping up all over the country in pharmacies, drugstores, and shopping centers. They're usually staffed by nurse practitioners or physician assistants that have been trained to treat common illnesses and complaints. They're usually fairly quick and inexpensive. However, if you have serious medical issues or chronic medical problems, these are probably not your best  option.  No Primary Care Doctor: - Call Health Connect at  [phone] - they can help you locate a primary care doctor that  accepts your insurance, provides certain services, etc. - Physician Referral Service- [phone]  Chronic Pain Problems: Organization          Address  Phone   Notes  [facility]  [phone] Patients need to be referred by their primary care doctor.   Medication Assistance: Organization         Address  Phone   Notes  [facility] Medication [facility] [address] [place], [place] [address] [phone] --Must be a resident of [facility] -- Must have NO insurance coverage whatsoever (no Medicaid/ Medicare, etc.) -- The pt. MUST have a primary care doctor that directs their care regularly and follows them in the community   [facility]ist  [phone]   [employer]  [phone]    Agencies that provide inexpensive medical care: Organization         Address  Phone   Notes  [facility]  [phone]   [facility]    [phone]   [facility] [address] [place], [place] [address] [phone]   [facility] [address] [place] [place] [phone]   Planned Parenthood    [phone]   [facility]    [phone]   Community Health and [facility]  [address], [place] Phone:  [phone], Fax:  [phone] Hours of Operation:  9 am - 6 pm, M-F.  Also accepts Medicaid/Medicare and self-pay.  [facility]  [address], [place] Phone: [phone], Fax: [phone]. Hours of Operation:  8:30 am - 5:30 pm, M-F.  Also accepts Medicaid and self-pay.  [facility] [place] [address] [place] Phone: [phone]   [facility] [address] Natasha Bence [place], [place] [phone], Ext. [phone] Mondays & Thursdays: 7-9 AM.  First 15 patients are seen on a first come, first serve basis.    Medicaid-accepting [facility] Providers:  Organization         Address  Phone   Notes  [facility] [address], [place] [phone] Also accepts self-pay patients.  [facility] [address] Laurell Josephs [place], [place]  [phone]   [facility] [address], [place] [phone]   [facility] [address], [place] [phone]   Renaye Rakers [address], [place]   [phone] Only accepts [place] Access [place] patients after they have their name applied to their card.   Self-Pay (no insurance) in [facility]:  Organization         Address  Phone   Notes  Sickle Cell Patients, [facility] Internal Medicine [address] [place], [place] [phone]   [facility] [address] [place], [place] [phone]   [facility] [place]  [address], [place] [phone]   [facility]/Dr. Osei-Bonsu  [address] [place] or [address], [place] [phone] Phone number for both [place] and [place] locations is the same.  [facility]gent [facility] and [facility] [address], [place] Otto (  [phone]   [facility] [address] [place] or [address] [phone]   [facility] [address], [place] [phone], phone; [phone], fax Sees patients 1st and 3rd Saturday of every month.  Must not qualify for public or private insurance (i.e. Medicaid, Medicare, [place] Health Choice, Veterans' Benefits)  Household income should be no more than 200% of the poverty level The clinic cannot treat you if you are pregnant or think you are pregnant  Sexually transmitted diseases are not treated at the clinic.    Dental Care: Organization         Address  Phone  Notes  [facility] Department of [facility] [address] [place], [place] [phone] Accepts children up to age 26 who are enrolled in [place] or [place] Health Choice; pregnant women with a Medicaid card; and  children who have applied for Medicaid or [place] Health Choice, but were declined, whose parents can pay a reduced fee at time of service.  [facility]  [address], [place] [phone] Accepts children up to age 40 who are enrolled in [place] or [place] Health Choice; pregnant women with a Medicaid card; and children who have applied for Medicaid or  Health Choice, but were declined, whose parents can pay a reduced fee at time of service.  [facility]  [address] [place], [place] [phone] Patients are seen by appointment only. Walk-ins are not accepted. [facility] will see patients 26 years of age and older. Monday - Tuesday (8am-5pm) Most Wednesdays (8:30-5pm) $30 per visit, cash only  [facility] Adult Dental Access [facility]  [address], [facility] [phone] Patients are seen by appointment only. Walk-ins are not accepted. [facility] will see patients 59 years of age and older. One Wednesday Evening (Monthly: Volunteer Based).  $30 per visit, cash only  [employer]  [phone] for adults; Children under age 68, call Graduate Pediatric Dentistry at [phone]. Children aged 87-14, please call [phone] to request a pediatric application.  Dental services are provided in all areas of dental care including fillings, crowns and bridges, complete and partial dentures, implants, gum treatment, root canals, and extractions. Preventive care is also provided. Treatment is provided to both adults and children. Patients are selected via a lottery and there is often a waiting list.   [facility] [address] [place]  [phone] [email or web address]   [facility] [address] [place], [place] [phone], Ext. [phone] Second and Fourth Thursday of each month, opens at 6:30 AM; Clinic ends at 9 AM.  Patients are seen on a first-come first-served  basis, and a limited number are seen during each clinic.   [facility]  [address] Ether Griffins [place], [place] [phone]   Eligibility Requirements You must have lived in [place], [place], or [place] counties for at least the last three months.   You cannot be eligible for state or federal sponsored [place], including [employer], [place], or [employer].   You generally cannot be eligible for healthcare insurance through your employer.    How to apply: Eligibility screenings are held every Tuesday and Wednesday afternoon from 1:00 pm until 4:00 pm. You do not need an appointment for the interview!  [facility] [address] [place],  [place] [phone]   [facility] Health Department  [phone]   [facility] Health Department  [phone]   [facility] Health Department  [phone]    Behavioral Health Resources in the Community: Intensive Outpatient Programs Organization         Address  Phone  Notes  [facility] Services [address] [place], [place] [phone]   [facility] Outpatient [address] [place], [place] [phone]   [facility] [address] [place], [place]  [phone]   [facility] Mental Health [address]  [place], [place] [phone] or [phone]   Substance Abuse Resources Organization         Address  Phone  Notes  [facility] Drug Services  [phone]   [facility]  [phone]   [facility]  [phone]   Floydene Flock  [phone]   Residential & Outpatient Substance Abuse Program  [phone]   Psychological Services Organization         Address  Phone  Notes  [facility]  [phone]   [facility] Services  [phone]   [facility] [address] [place] [phone] or [phone]    Mobile Crisis Teams Organization          Address  Phone  Notes  [facility], Mobile Crisis Care Unit  [phone]   [facility]  [address] [place], [place] [phone]   Doristine Locks [address] [place] [phone]    Self-Help/Support Groups Organization         Address  Phone             Notes  [facility] [place] - variety of support groups  [phone]- I7437963 Call for more information  Narcotics Anonymous (NA), [facility] [address], [employer] [place]  2 meetings at this location   Statistician         Address  Phone  Notes  [facility] [address],    [place]  [phone]   [facility]  [address], [place] [phone], [place], [place] [phone]   [facility] [address] [place], [place] [phone] Admissions: 8am-3pm M-F  [facility] [address]    [place], [place] [phone]   [facility] [address] [place], [place], [place] [phone]   [facility] [address]  [place], [place] [phone]   [facility] - Intensive Outpatient [address] Laurell Josephs [address], [place], [place] [phone]   [facility] (Addiction Recovery [facility] [address] [place].,  [place], [place] [phone] or [phone]   [facility] ([facility]) [address] [place], [place] [phone] Accepts Medicaid  [facility] [address]  [place] [phone] Substance Abuse/Addiction Treatment   [facility] Organization         Address  Phone  Notes  [facility]  [phone]   Angie Fava, PhD [address] Ervin Knack [place], [place]   [phone] or [phone]   [facility]   [address] [place], [place] [phone]   [facility] [address] [place], [place] [phone] Insurance/Medicaid/sponsorship  through [employer] and Families [address]  [place], [place] [phone] [place], [place] [phone]    Dr. Adele Schilder  [phone]   [facility] Dept. 1) [address] [place] 2) [place] 3)  [address], [place] [phone]  [phone]   [place] [phone] or [phone] (After Hours)

## 2015-02-20 NOTE — ED Notes (Signed)
Right lower toothache with swelling x 2 days

## 2015-02-22 ENCOUNTER — Encounter (HOSPITAL_BASED_OUTPATIENT_CLINIC_OR_DEPARTMENT_OTHER): Payer: Self-pay | Admitting: Emergency Medicine

## 2015-02-22 ENCOUNTER — Emergency Department (HOSPITAL_BASED_OUTPATIENT_CLINIC_OR_DEPARTMENT_OTHER)
Admission: EM | Admit: 2015-02-22 | Discharge: 2015-02-22 | Discharge status: Against Medical Advice | Payer: Medicaid Other | Attending: Emergency Medicine | Admitting: Emergency Medicine

## 2015-02-22 DIAGNOSIS — F17210 Nicotine dependence, cigarettes, uncomplicated: Secondary | ICD-10-CM | POA: Diagnosis not present

## 2015-02-22 DIAGNOSIS — I10 Essential (primary) hypertension: Secondary | ICD-10-CM | POA: Diagnosis not present

## 2015-02-22 DIAGNOSIS — R220 Localized swelling, mass and lump, head: Secondary | ICD-10-CM | POA: Diagnosis present

## 2015-02-22 MED ORDER — ACETAMINOPHEN 325 MG PO TABS
650.0000 mg | ORAL_TABLET | Freq: Once | ORAL | Status: AC
  Administered 2015-02-22: 650 mg via ORAL
  Filled 2015-02-22: qty 2

## 2015-02-22 NOTE — ED Notes (Signed)
Called for tx area x 1 with no answer

## 2015-02-22 NOTE — ED Notes (Signed)
Called for tx area x 2 with no answer

## 2015-02-22 NOTE — ED Notes (Signed)
Staff searched within waiting area and outside for pt, pt is not present. Assumed to have LWBS.

## 2015-02-22 NOTE — ED Notes (Signed)
Pt seen yesterday for same.  Sts he is taking his abx as prescribed but the swelling is considerably worse today.  Having chills.

## 2016-03-29 IMAGING — DX DG CHEST 2V
2 series · 2 of 2 positions shown · non-contrast
Comparison: Chest radiograph performed 11/30/2014

CLINICAL DATA: Acute onset of generalized chest pain with
inspiration. Productive cough and shortness of breath. Initial
encounter.

EXAM:
CHEST  2 VIEW

[chest pa]
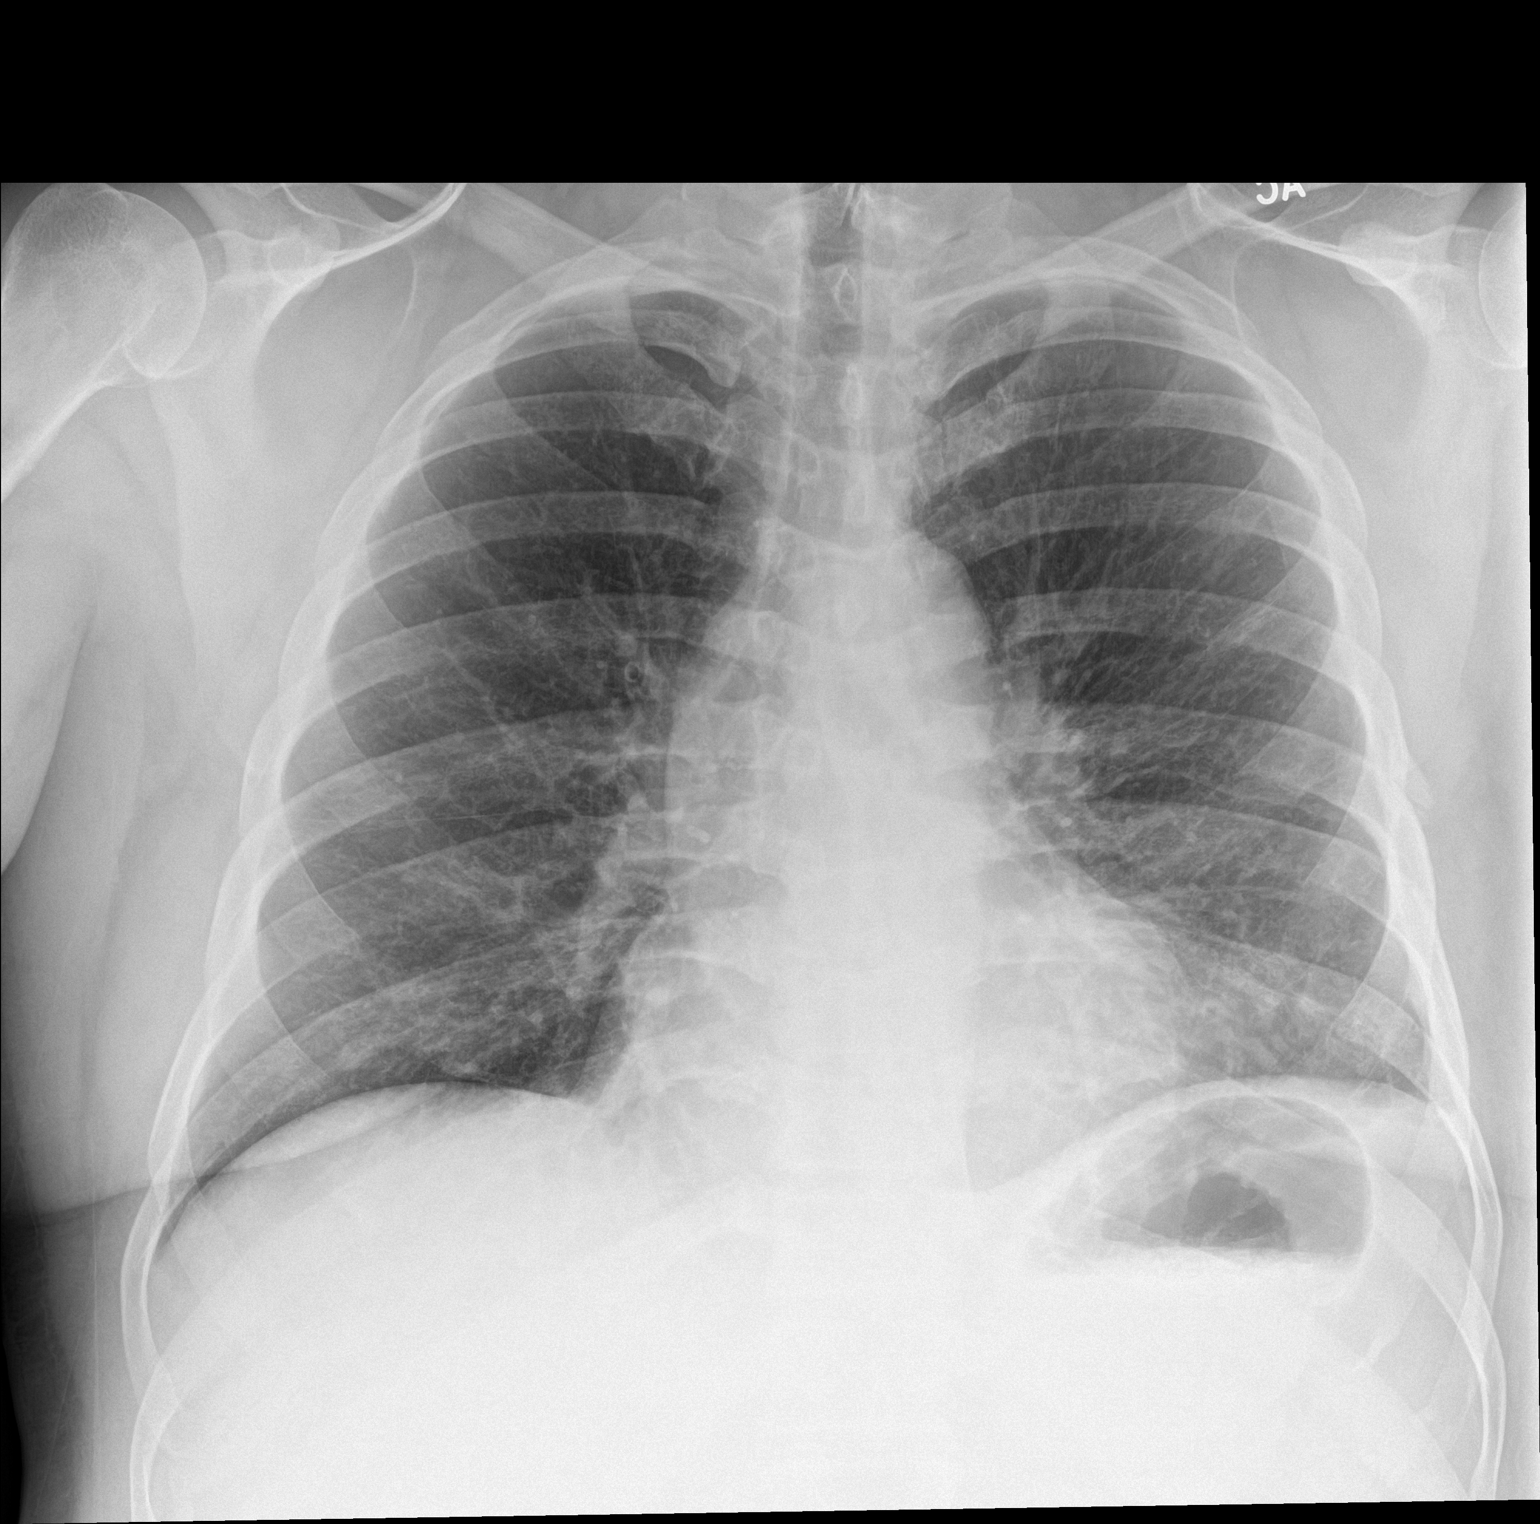

[chest lat]
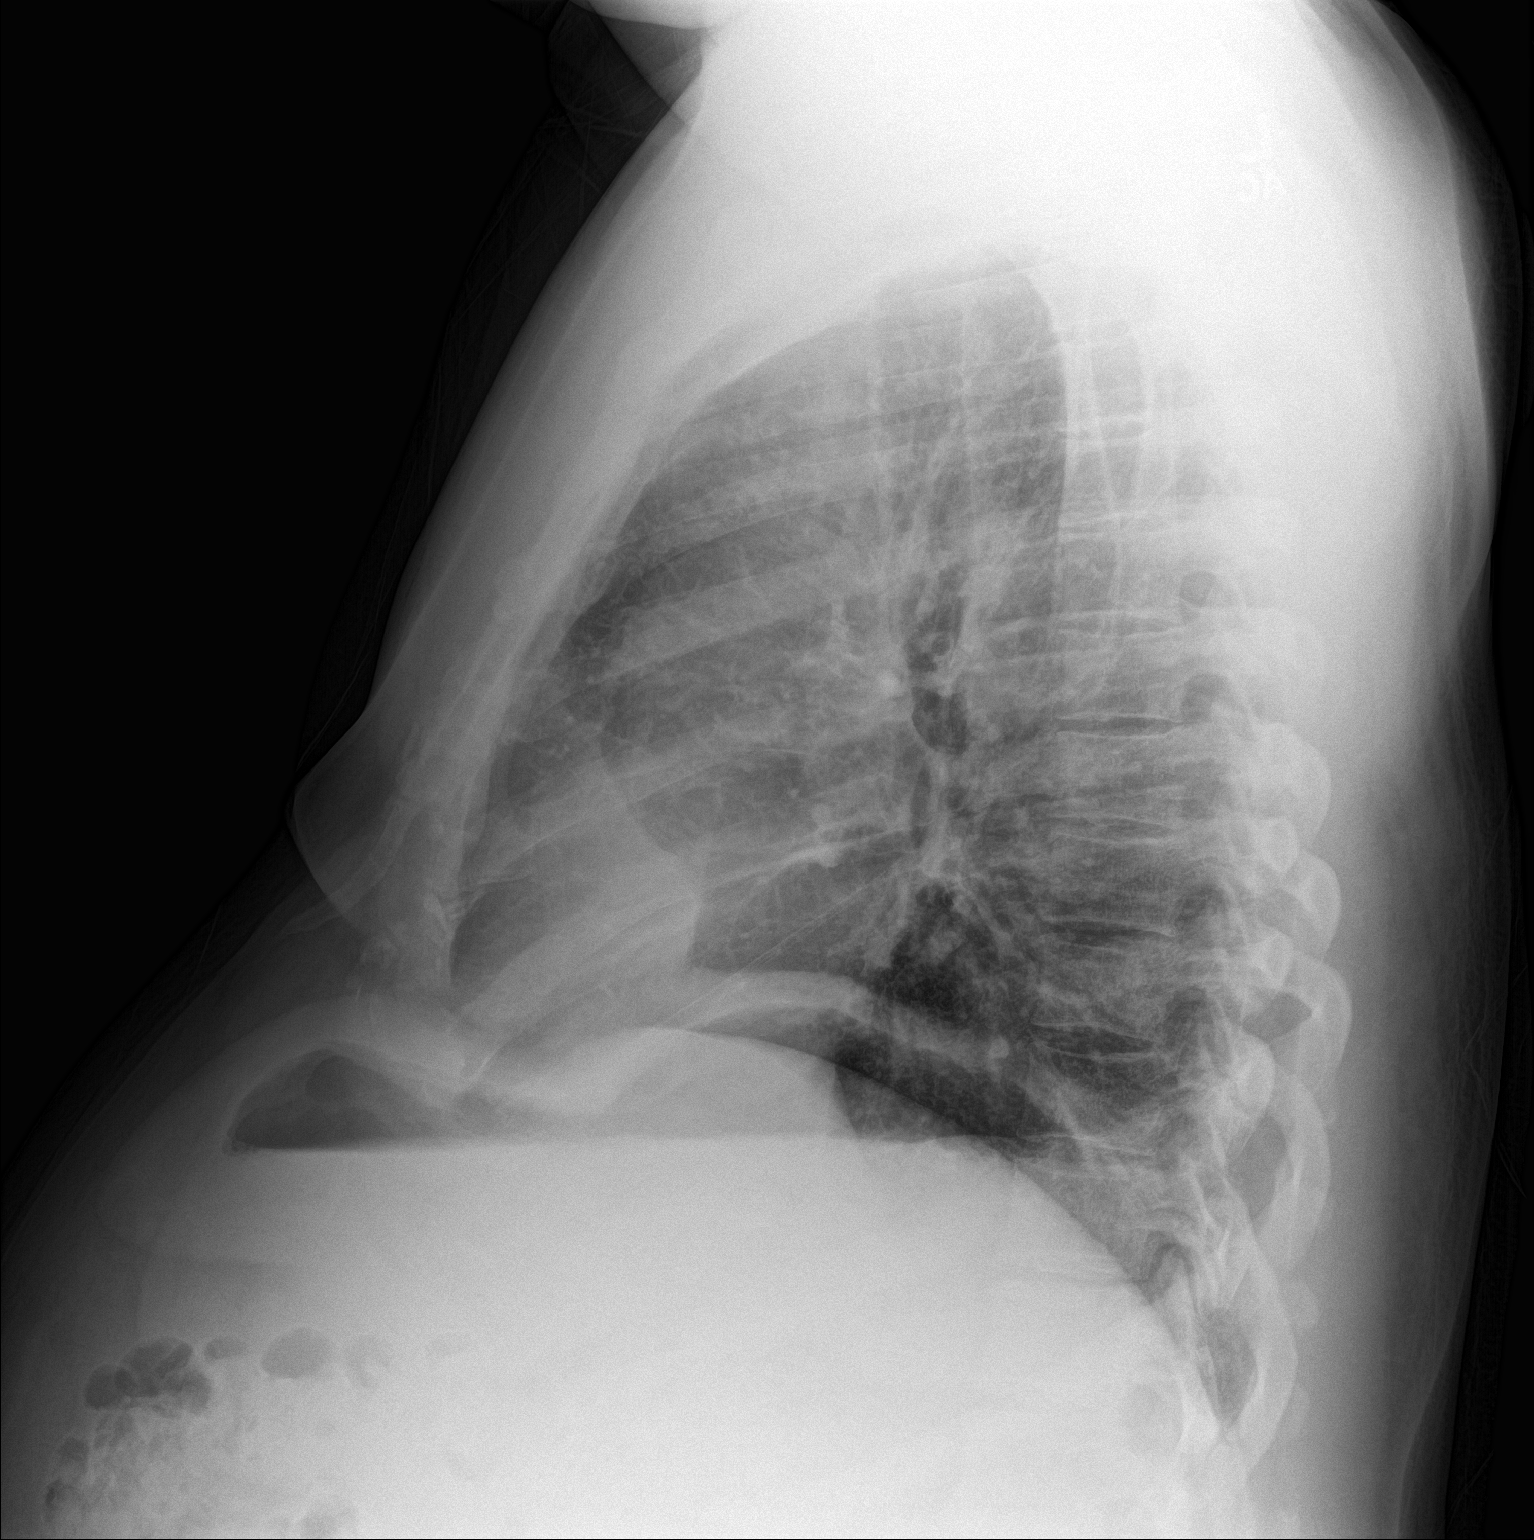

[2 of 2 positions shown; findings below may reference images not displayed]

FINDINGS: The lungs are well-aerated. Minimal left basilar atelectasis is
noted. There is no evidence of pleural effusion or pneumothorax.

The heart is normal in size; the mediastinal contour is within
normal limits. No acute osseous abnormalities are seen.
IMPRESSION: Minimal left basilar atelectasis noted.  Lungs otherwise clear.

## 2016-05-05 ENCOUNTER — Encounter (HOSPITAL_BASED_OUTPATIENT_CLINIC_OR_DEPARTMENT_OTHER): Payer: Self-pay | Admitting: Emergency Medicine

## 2016-05-05 ENCOUNTER — Emergency Department (HOSPITAL_BASED_OUTPATIENT_CLINIC_OR_DEPARTMENT_OTHER)
Admission: EM | Admit: 2016-05-05 | Discharge: 2016-05-05 | Disposition: A | Discharge status: Home / Self Care | Payer: Medicaid Other | Attending: Emergency Medicine | Admitting: Emergency Medicine

## 2016-05-05 DIAGNOSIS — Z79899 Other long term (current) drug therapy: Secondary | ICD-10-CM | POA: Diagnosis not present

## 2016-05-05 DIAGNOSIS — F17210 Nicotine dependence, cigarettes, uncomplicated: Secondary | ICD-10-CM | POA: Insufficient documentation

## 2016-05-05 DIAGNOSIS — R369 Urethral discharge, unspecified: Secondary | ICD-10-CM | POA: Diagnosis present

## 2016-05-05 DIAGNOSIS — I10 Essential (primary) hypertension: Secondary | ICD-10-CM | POA: Insufficient documentation

## 2016-05-05 DIAGNOSIS — Z202 Contact with and (suspected) exposure to infections with a predominantly sexual mode of transmission: Secondary | ICD-10-CM | POA: Insufficient documentation

## 2016-05-05 MED ORDER — AZITHROMYCIN 250 MG PO TABS
1000.0000 mg | ORAL_TABLET | Freq: Once | ORAL | Status: AC
  Administered 2016-05-05: 1000 mg via ORAL
  Filled 2016-05-05: qty 4

## 2016-05-05 MED ORDER — IBUPROFEN 800 MG PO TABS
800.0000 mg | ORAL_TABLET | Freq: Once | ORAL | Status: AC
  Administered 2016-05-05: 800 mg via ORAL
  Filled 2016-05-05: qty 1

## 2016-05-05 MED ORDER — CEFTRIAXONE SODIUM 250 MG IJ SOLR
250.0000 mg | Freq: Once | INTRAMUSCULAR | Status: AC
  Administered 2016-05-05: 250 mg via INTRAMUSCULAR
  Filled 2016-05-05: qty 250

## 2016-05-05 MED ORDER — LIDOCAINE HCL (PF) 1 % IJ SOLN
INTRAMUSCULAR | Status: AC
  Administered 2016-05-05: 1 mL
  Filled 2016-05-05: qty 5

## 2016-05-05 NOTE — ED Notes (Signed)
ED Provider at bedside. 

## 2016-05-05 NOTE — ED Provider Notes (Signed)
[facility] Provider Note   CSN: [id number] Arrival date & time: 05/05/16  1434     History   Chief Complaint Chief Complaint  Patient presents with  . Penile Discharge    HPI Steven King is a 56 y.o. male.  HPI  56 y.o. male with a hx of HTN, presents to the Emergency Department today complaining of penile discharge that occurred x 3 days ago. Notes dysuria as well. Noted last intercourse on Friday with new partner. No fevers. No penile swelling. No testicular pain. No other symptoms noted.   Past Medical History:  Diagnosis Date  . Hypertension     There are no active problems to display for this patient.   Past Surgical History:  Procedure Laterality Date  . ABDOMINAL SURGERY         Home Medications    Prior to Admission medications   Medication Sig Start Date End Date Taking? Authorizing Provider  lisinopril (PRINIVIL,ZESTRIL) 10 MG tablet Take 1 tablet (10 mg total) by mouth daily. 11/30/14  Yes Marily Memos, MD  oxyCODONE-acetaminophen (PERCOCET/ROXICET) 5-325 MG per tablet Take 1 tablet by mouth every 6 (six) hours as needed for severe pain. 06/20/14   Tiffany Neva Seat, PA-C  predniSONE (DELTASONE) 20 MG tablet 3 tabs po day one, then 2 tabs daily x 4 days 11/30/14   Marily Memos, MD    Family History No family history on file.  Social History Social History  Substance Use Topics  . Smoking status: Current Every Day Smoker    Packs/day: 0.50    Types: Cigarettes  . Smokeless tobacco: Never Used  . Alcohol use Yes     Comment: occasionally     Allergies   Patient has no known allergies.   Review of Systems Review of Systems  Constitutional: Negative for fever.  Gastrointestinal: Negative for nausea and vomiting.  Genitourinary: Positive for discharge and dysuria. Negative for penile pain, scrotal swelling and testicular pain.   Physical Exam Updated Vital Signs BP (!) 157/110   Pulse 86   Temp 98.1 F (36.7 C) (Oral)   Resp  17   Ht  (1.803 m)   Wt 117.9 kg   SpO2 97%   BMI 36.26 kg/m   Physical Exam  Constitutional: He is oriented to person, place, and time. Vital signs are normal. He appears well-developed and well-nourished.  HENT:  Head: Normocephalic.  Right Ear: Hearing normal.  Left Ear: Hearing normal.  Eyes: Conjunctivae and EOM are normal. Pupils are equal, round, and reactive to light.  Cardiovascular: Normal rate and regular rhythm.   Pulmonary/Chest: Effort normal.  Genitourinary: Testes normal and penis normal. Right testis shows no tenderness. Left testis shows no tenderness. Circumcised.  Genitourinary Comments: Chaperone present. Yellow penile discharge noted. No erythema. No swelling. No testicular pain.   Neurological: He is alert and oriented to person, place, and time.  Skin: Skin is warm and dry.  Psychiatric: He has a normal mood and affect. His speech is normal and behavior is normal. Thought content normal.  Nursing note and vitals reviewed.  ED Treatments / Results  Labs (all labs ordered are listed, but only abnormal results are displayed) Labs Reviewed  URINE CULTURE  URINALYSIS, ROUTINE W REFLEX MICROSCOPIC  GC/CHLAMYDIA PROBE AMP ([place]) NOT AT [facility]   EKG  EKG Interpretation None       Radiology No results found.  Procedures Procedures (including critical care time)  Medications Ordered in ED Medications - No  data to display   Initial Impression / Assessment and Plan / ED Course  I have reviewed the triage vital signs and the nursing notes.  Pertinent labs & imaging results that were available during my care of the patient were reviewed by me and considered in my medical decision making (see chart for details).  Final Clinical Impressions(s) / ED Diagnoses  {I have reviewed and evaluated the relevant laboratory values.   {I have reviewed the relevant previous healthcare records.  {I obtained HPI from historian.   ED Course:  Assessment:  Pt is a 56 y.o. male who presents with penile discharge x 3 days. Yellow in color. Sexual intercourse with new partner last week. No fevers. No testicular pain or swelling. On exam, pt in NAD. Nontoxic/nonseptic appearing. VSS. Afebrile. Penile discharge noted. GC obtained. Given Rocephin/Azithro in ED. Counseled on safe sex practices. Plan is to DC home with follow up to PCP. At time of discharge, Patient is in no acute distress. Vital Signs are stable. Patient is able to ambulate. Patient able to tolerate PO.   Disposition/Plan:  DC Home Additional Verbal discharge instructions given and discussed with patient.  Pt Instructed to f/u with PCP in the next week for evaluation and treatment of symptoms. Return precautions given Pt acknowledges and agrees with plan  Supervising Physician Raeford Razor, MD  Final diagnoses:  STD exposure    New Prescriptions New Prescriptions   No medications on file       Audry Pili, PA-C 05/05/16 1525    Raeford Razor, MD 05/05/16 1540

## 2016-05-05 NOTE — ED Triage Notes (Signed)
Pt with penile discharge and painful urination x3 days. Last date of intercourse Friday.

## 2016-05-05 NOTE — Discharge Instructions (Signed)
Please read and follow all provided instructions.  Your diagnoses today include:  1. STD exposure     Tests performed today include: Test for gonorrhea and chlamydia. You will be notified by telephone if you have a positive result. Vital signs. See below for your results today.   Medications:  You were treated for chlamydia (1 gram azithromycin pills) and gonorrhea (  rocephin shot).  Home care instructions:  Read educational materials contained in this packet and follow any instructions provided.   You should tell your partners about your infection and avoid having sex for one week to allow time for the medicine to work.  Follow-up instructions: You should follow-up with the [facility]D clinic to be tested for HIV, syphilis, and hepatitis -- all of which can be transmitted by sexual contact. We do not routinely screen for these in the Emergency Department.  STD Testing: [facility] Department of [facility] [place], [facility] [address] [place], phone [phone] or [phone]   Monday - Friday, call for an appointment [facility] Department of [facility], [facility] [address] [place], phone [phone] or [phone]  Monday - Friday, call for an appointment  Return instructions:  Please return to the Emergency Department if you experience worsening symptoms.  Please return if you have any other emergent concerns.  Additional Information:  Your vital signs today were: BP (!) 157/110    Pulse 86    Temp 98.1 F (36.7 C) (Oral)    Resp 17    Ht  (1.803 m)    Wt 117.9 kg    SpO2 97%    BMI 36.26 kg/m  If your blood pressure (BP) was elevated above 135/85 this visit, please have this repeated by your doctor within one month. --------------

## 2016-05-05 NOTE — ED Notes (Signed)
Pt given cup of water before attempting to provide a sample.

## 2016-05-05 NOTE — ED Notes (Signed)
Patient denies pain and is resting comfortably.  

## 2016-05-05 NOTE — ED Notes (Signed)
Pt is aware that a urine sample is needed but is unable to obtain one at this time. 

## 2016-05-06 LAB — GC/CHLAMYDIA PROBE AMP (~~LOC~~) NOT AT ARMC
CHLAMYDIA, DNA PROBE: NEGATIVE
NEISSERIA GONORRHEA: POSITIVE — AB
